# Patient Record
Sex: Male | Born: 1960 | Marital: Single | Smoking: Former smoker
Health system: Southern US, Community
[De-identification: ages and names within clinical notes are randomized; demographics above are authoritative.]

## PROBLEM LIST (undated history)

## (undated) DIAGNOSIS — D649 Anemia, unspecified: Secondary | ICD-10-CM

## (undated) DIAGNOSIS — I340 Nonrheumatic mitral (valve) insufficiency: Secondary | ICD-10-CM

## (undated) DIAGNOSIS — I10 Essential (primary) hypertension: Secondary | ICD-10-CM

## (undated) DIAGNOSIS — I5043 Acute on chronic combined systolic (congestive) and diastolic (congestive) heart failure: Secondary | ICD-10-CM

## (undated) DIAGNOSIS — I350 Nonrheumatic aortic (valve) stenosis: Secondary | ICD-10-CM

## (undated) DIAGNOSIS — I272 Pulmonary hypertension, unspecified: Secondary | ICD-10-CM

## (undated) DIAGNOSIS — I4891 Unspecified atrial fibrillation: Secondary | ICD-10-CM

## (undated) DIAGNOSIS — N186 End stage renal disease: Secondary | ICD-10-CM

## (undated) DIAGNOSIS — I2510 Atherosclerotic heart disease of native coronary artery without angina pectoris: Secondary | ICD-10-CM

## (undated) DIAGNOSIS — I50810 Right heart failure, unspecified: Secondary | ICD-10-CM

## (undated) DIAGNOSIS — B9561 Methicillin susceptible Staphylococcus aureus infection as the cause of diseases classified elsewhere: Secondary | ICD-10-CM

## (undated) DIAGNOSIS — E785 Hyperlipidemia, unspecified: Secondary | ICD-10-CM

## (undated) DIAGNOSIS — I071 Rheumatic tricuspid insufficiency: Secondary | ICD-10-CM

## (undated) DIAGNOSIS — R7881 Bacteremia: Secondary | ICD-10-CM

## (undated) HISTORY — DX: Nonrheumatic aortic (valve) stenosis: I35.0

## (undated) HISTORY — DX: Methicillin susceptible Staphylococcus aureus infection as the cause of diseases classified elsewhere: B95.61

## (undated) HISTORY — PX: OTHER SURGICAL HISTORY: SHX169

## (undated) HISTORY — DX: Atherosclerotic heart disease of native coronary artery without angina pectoris: I25.10

## (undated) HISTORY — DX: Essential (primary) hypertension: I10

## (undated) HISTORY — DX: Pulmonary hypertension, unspecified: I27.20

## (undated) HISTORY — DX: Anemia, unspecified: D64.9

## (undated) HISTORY — DX: Bacteremia: R78.81

## (undated) HISTORY — DX: Unspecified atrial fibrillation: I48.91

## (undated) HISTORY — DX: Hyperlipidemia, unspecified: E78.5

## (undated) HISTORY — DX: Nonrheumatic mitral (valve) insufficiency: I34.0

## (undated) HISTORY — PX: ESOPHAGOGASTRODUODENOSCOPY: SHX1529

## (undated) HISTORY — DX: End stage renal disease: N18.6

---

## 2016-05-24 DIAGNOSIS — I340 Nonrheumatic mitral (valve) insufficiency: Secondary | ICD-10-CM

## 2016-05-24 DIAGNOSIS — I350 Nonrheumatic aortic (valve) stenosis: Secondary | ICD-10-CM

## 2016-05-24 DIAGNOSIS — I272 Pulmonary hypertension, unspecified: Secondary | ICD-10-CM | POA: Insufficient documentation

## 2016-05-24 DIAGNOSIS — I10 Essential (primary) hypertension: Secondary | ICD-10-CM | POA: Insufficient documentation

## 2016-05-24 HISTORY — DX: Nonrheumatic aortic (valve) stenosis: I35.0

## 2016-05-24 HISTORY — DX: Nonrheumatic mitral (valve) insufficiency: I34.0

## 2016-05-25 ENCOUNTER — Encounter: Payer: Medicare Other | Admitting: Thoracic Surgery (Cardiothoracic Vascular Surgery)

## 2016-05-31 ENCOUNTER — Inpatient Hospital Stay (HOSPITAL_COMMUNITY)
Admission: AD | Admit: 2016-05-31 | Discharge: 2016-06-09 | DRG: 286 | Disposition: A | Discharge status: Hospice | Payer: Medicare Other | Source: Other Acute Inpatient Hospital | Attending: Internal Medicine | Admitting: Internal Medicine

## 2016-05-31 DIAGNOSIS — T82898A Other specified complication of vascular prosthetic devices, implants and grafts, initial encounter: Secondary | ICD-10-CM | POA: Diagnosis not present

## 2016-05-31 DIAGNOSIS — R570 Cardiogenic shock: Secondary | ICD-10-CM | POA: Diagnosis present

## 2016-05-31 DIAGNOSIS — R579 Shock, unspecified: Secondary | ICD-10-CM | POA: Diagnosis present

## 2016-05-31 DIAGNOSIS — Y848 Other medical procedures as the cause of abnormal reaction of the patient, or of later complication, without mention of misadventure at the time of the procedure: Secondary | ICD-10-CM | POA: Diagnosis present

## 2016-05-31 DIAGNOSIS — B9562 Methicillin resistant Staphylococcus aureus infection as the cause of diseases classified elsewhere: Secondary | ICD-10-CM | POA: Diagnosis present

## 2016-05-31 DIAGNOSIS — J9621 Acute and chronic respiratory failure with hypoxia: Secondary | ICD-10-CM | POA: Diagnosis present

## 2016-05-31 DIAGNOSIS — T80219A Unspecified infection due to central venous catheter, initial encounter: Principal | ICD-10-CM | POA: Diagnosis present

## 2016-05-31 DIAGNOSIS — N186 End stage renal disease: Secondary | ICD-10-CM | POA: Diagnosis present

## 2016-05-31 DIAGNOSIS — R7881 Bacteremia: Secondary | ICD-10-CM | POA: Diagnosis present

## 2016-05-31 DIAGNOSIS — J9600 Acute respiratory failure, unspecified whether with hypoxia or hypercapnia: Secondary | ICD-10-CM | POA: Diagnosis present

## 2016-05-31 DIAGNOSIS — E1122 Type 2 diabetes mellitus with diabetic chronic kidney disease: Secondary | ICD-10-CM | POA: Diagnosis present

## 2016-05-31 DIAGNOSIS — E785 Hyperlipidemia, unspecified: Secondary | ICD-10-CM | POA: Diagnosis present

## 2016-05-31 DIAGNOSIS — Z6833 Body mass index (BMI) 33.0-33.9, adult: Secondary | ICD-10-CM | POA: Diagnosis not present

## 2016-05-31 DIAGNOSIS — J449 Chronic obstructive pulmonary disease, unspecified: Secondary | ICD-10-CM | POA: Diagnosis present

## 2016-05-31 DIAGNOSIS — Z86718 Personal history of other venous thrombosis and embolism: Secondary | ICD-10-CM

## 2016-05-31 DIAGNOSIS — E669 Obesity, unspecified: Secondary | ICD-10-CM | POA: Diagnosis present

## 2016-05-31 DIAGNOSIS — Z515 Encounter for palliative care: Secondary | ICD-10-CM | POA: Diagnosis present

## 2016-05-31 DIAGNOSIS — I132 Hypertensive heart and chronic kidney disease with heart failure and with stage 5 chronic kidney disease, or end stage renal disease: Secondary | ICD-10-CM | POA: Diagnosis present

## 2016-05-31 DIAGNOSIS — N179 Acute kidney failure, unspecified: Secondary | ICD-10-CM | POA: Diagnosis not present

## 2016-05-31 DIAGNOSIS — N189 Chronic kidney disease, unspecified: Secondary | ICD-10-CM | POA: Diagnosis present

## 2016-05-31 DIAGNOSIS — J810 Acute pulmonary edema: Secondary | ICD-10-CM | POA: Diagnosis not present

## 2016-05-31 DIAGNOSIS — I071 Rheumatic tricuspid insufficiency: Secondary | ICD-10-CM | POA: Diagnosis present

## 2016-05-31 DIAGNOSIS — L8990 Pressure ulcer of unspecified site, unspecified stage: Secondary | ICD-10-CM | POA: Diagnosis present

## 2016-05-31 DIAGNOSIS — I5043 Acute on chronic combined systolic (congestive) and diastolic (congestive) heart failure: Secondary | ICD-10-CM | POA: Diagnosis present

## 2016-05-31 DIAGNOSIS — D631 Anemia in chronic kidney disease: Secondary | ICD-10-CM | POA: Diagnosis present

## 2016-05-31 DIAGNOSIS — R0602 Shortness of breath: Secondary | ICD-10-CM | POA: Diagnosis present

## 2016-05-31 DIAGNOSIS — Z66 Do not resuscitate: Secondary | ICD-10-CM | POA: Diagnosis present

## 2016-05-31 DIAGNOSIS — I2510 Atherosclerotic heart disease of native coronary artery without angina pectoris: Secondary | ICD-10-CM | POA: Diagnosis present

## 2016-05-31 DIAGNOSIS — I4891 Unspecified atrial fibrillation: Secondary | ICD-10-CM | POA: Diagnosis present

## 2016-05-31 DIAGNOSIS — I083 Combined rheumatic disorders of mitral, aortic and tricuspid valves: Secondary | ICD-10-CM | POA: Diagnosis present

## 2016-05-31 DIAGNOSIS — Z79899 Other long term (current) drug therapy: Secondary | ICD-10-CM

## 2016-05-31 DIAGNOSIS — Z992 Dependence on renal dialysis: Secondary | ICD-10-CM

## 2016-05-31 DIAGNOSIS — I9589 Other hypotension: Secondary | ICD-10-CM | POA: Diagnosis present

## 2016-05-31 DIAGNOSIS — I270 Primary pulmonary hypertension: Secondary | ICD-10-CM | POA: Diagnosis present

## 2016-05-31 DIAGNOSIS — Z452 Encounter for adjustment and management of vascular access device: Secondary | ICD-10-CM

## 2016-05-31 DIAGNOSIS — D649 Anemia, unspecified: Secondary | ICD-10-CM | POA: Diagnosis not present

## 2016-05-31 DIAGNOSIS — I50810 Right heart failure, unspecified: Secondary | ICD-10-CM

## 2016-05-31 DIAGNOSIS — I340 Nonrheumatic mitral (valve) insufficiency: Secondary | ICD-10-CM | POA: Diagnosis present

## 2016-05-31 DIAGNOSIS — I350 Nonrheumatic aortic (valve) stenosis: Secondary | ICD-10-CM

## 2016-05-31 DIAGNOSIS — I10 Essential (primary) hypertension: Secondary | ICD-10-CM | POA: Diagnosis present

## 2016-05-31 DIAGNOSIS — I248 Other forms of acute ischemic heart disease: Secondary | ICD-10-CM | POA: Diagnosis present

## 2016-05-31 DIAGNOSIS — J811 Chronic pulmonary edema: Secondary | ICD-10-CM

## 2016-05-31 DIAGNOSIS — Z9981 Dependence on supplemental oxygen: Secondary | ICD-10-CM

## 2016-05-31 DIAGNOSIS — I361 Nonrheumatic tricuspid (valve) insufficiency: Secondary | ICD-10-CM | POA: Diagnosis not present

## 2016-05-31 DIAGNOSIS — I272 Pulmonary hypertension, unspecified: Secondary | ICD-10-CM | POA: Diagnosis present

## 2016-05-31 DIAGNOSIS — I342 Nonrheumatic mitral (valve) stenosis: Secondary | ICD-10-CM | POA: Diagnosis not present

## 2016-05-31 DIAGNOSIS — N185 Chronic kidney disease, stage 5: Secondary | ICD-10-CM | POA: Diagnosis not present

## 2016-05-31 DIAGNOSIS — Z09 Encounter for follow-up examination after completed treatment for conditions other than malignant neoplasm: Secondary | ICD-10-CM

## 2016-05-31 DIAGNOSIS — J9601 Acute respiratory failure with hypoxia: Secondary | ICD-10-CM | POA: Diagnosis not present

## 2016-05-31 HISTORY — DX: Rheumatic tricuspid insufficiency: I07.1

## 2016-05-31 HISTORY — DX: Right heart failure, unspecified: I50.810

## 2016-05-31 HISTORY — DX: Acute on chronic combined systolic (congestive) and diastolic (congestive) heart failure: I50.43

## 2016-05-31 LAB — CORTISOL: Cortisol, Plasma: 17.2 ug/dL

## 2016-05-31 LAB — CBC WITH DIFFERENTIAL/PLATELET
BASOS ABS: 0 10*3/uL (ref 0.0–0.1)
Basophils Relative: 0 %
Eosinophils Absolute: 0.3 10*3/uL (ref 0.0–0.7)
Eosinophils Relative: 4 %
HEMATOCRIT: 27.9 % — AB (ref 39.0–52.0)
Hemoglobin: 8.1 g/dL — ABNORMAL LOW (ref 13.0–17.0)
LYMPHS ABS: 0.9 10*3/uL (ref 0.7–4.0)
Lymphocytes Relative: 11 %
MCH: 29.9 pg (ref 26.0–34.0)
MCHC: 29 g/dL — ABNORMAL LOW (ref 30.0–36.0)
MCV: 103 fL — ABNORMAL HIGH (ref 78.0–100.0)
MONOS PCT: 2 %
Monocytes Absolute: 0.2 10*3/uL (ref 0.1–1.0)
Neutro Abs: 6.9 10*3/uL (ref 1.7–7.7)
Neutrophils Relative %: 83 %
PLATELETS: 185 10*3/uL (ref 150–400)
RBC: 2.71 MIL/uL — AB (ref 4.22–5.81)
RDW: 20.8 % — AB (ref 11.5–15.5)
WBC: 8.3 10*3/uL (ref 4.0–10.5)

## 2016-05-31 LAB — COMPREHENSIVE METABOLIC PANEL
ALBUMIN: 2.9 g/dL — AB (ref 3.5–5.0)
ALT: 11 U/L — ABNORMAL LOW (ref 17–63)
AST: 24 U/L (ref 15–41)
Alkaline Phosphatase: 95 U/L (ref 38–126)
Anion gap: 12 (ref 5–15)
BUN: 27 mg/dL — AB (ref 6–20)
CHLORIDE: 105 mmol/L (ref 101–111)
CO2: 21 mmol/L — AB (ref 22–32)
Calcium: 8.6 mg/dL — ABNORMAL LOW (ref 8.9–10.3)
Creatinine, Ser: 4.6 mg/dL — ABNORMAL HIGH (ref 0.61–1.24)
GFR calc Af Amer: 15 mL/min — ABNORMAL LOW (ref >60)
GFR calc non Af Amer: 13 mL/min — ABNORMAL LOW (ref >60)
GLUCOSE: 82 mg/dL (ref 65–99)
POTASSIUM: 4.6 mmol/L (ref 3.5–5.1)
SODIUM: 138 mmol/L (ref 135–145)
Total Bilirubin: 2 mg/dL — ABNORMAL HIGH (ref 0.3–1.2)
Total Protein: 6.3 g/dL — ABNORMAL LOW (ref 6.5–8.1)

## 2016-05-31 LAB — PHOSPHORUS: Phosphorus: 4.7 mg/dL — ABNORMAL HIGH (ref 2.5–4.6)

## 2016-05-31 LAB — MRSA PCR SCREENING: MRSA BY PCR: POSITIVE — AB

## 2016-05-31 LAB — LACTIC ACID, PLASMA: Lactic Acid, Venous: 1.8 mmol/L (ref 0.5–1.9)

## 2016-05-31 LAB — MAGNESIUM: MAGNESIUM: 2 mg/dL (ref 1.7–2.4)

## 2016-05-31 LAB — PROCALCITONIN: PROCALCITONIN: 0.91 ng/mL

## 2016-05-31 MED ORDER — HEPARIN SODIUM (PORCINE) 5000 UNIT/ML IJ SOLN
5000.0000 [IU] | Freq: Three times a day (TID) | INTRAMUSCULAR | Status: DC
  Administered 2016-05-31 – 2016-06-09 (×24): 5000 [IU] via SUBCUTANEOUS
  Filled 2016-05-31 (×24): qty 1

## 2016-05-31 MED ORDER — ENOXAPARIN SODIUM 30 MG/0.3ML ~~LOC~~ SOLN: 30.0000 mg | SUBCUTANEOUS | Status: DC

## 2016-05-31 MED ORDER — SODIUM CHLORIDE 0.9 % IV SOLN
250.0000 mL | INTRAVENOUS | Status: DC | PRN
  Administered 2016-06-01: 250 mL via INTRAVENOUS

## 2016-05-31 MED ORDER — CETYLPYRIDINIUM CHLORIDE 0.05 % MT LIQD
7.0000 mL | Freq: Two times a day (BID) | OROMUCOSAL | Status: DC
  Administered 2016-06-01 – 2016-06-08 (×15): 7 mL via OROMUCOSAL

## 2016-05-31 MED ORDER — SODIUM CHLORIDE 0.9% FLUSH: 10.0000 mL | INTRAVENOUS | Status: DC | PRN

## 2016-05-31 MED ORDER — ACETAMINOPHEN 325 MG PO TABS
650.0000 mg | ORAL_TABLET | ORAL | Status: DC | PRN
Start: 2016-05-31 — End: 2016-06-09

## 2016-05-31 MED ORDER — SODIUM CHLORIDE 0.9% FLUSH: 3.0000 mL | INTRAVENOUS | Status: DC | PRN

## 2016-05-31 MED ORDER — PANTOPRAZOLE SODIUM 40 MG PO TBEC
40.0000 mg | DELAYED_RELEASE_TABLET | Freq: Every day | ORAL | Status: DC
Start: 2016-05-31 — End: 2016-06-09
  Administered 2016-05-31 – 2016-06-08 (×9): 40 mg via ORAL
  Filled 2016-05-31 (×9): qty 1

## 2016-05-31 MED ORDER — FAMOTIDINE IN NACL 20-0.9 MG/50ML-% IV SOLN: 20.0000 mg | Freq: Two times a day (BID) | INTRAVENOUS | Status: DC

## 2016-05-31 MED ORDER — SODIUM CHLORIDE 0.9 % IV SOLN: 250.0000 mL | INTRAVENOUS | Status: DC | PRN

## 2016-05-31 MED ORDER — SODIUM CHLORIDE 0.9% FLUSH
10.0000 mL | Freq: Two times a day (BID) | INTRAVENOUS | Status: DC
  Administered 2016-06-01 – 2016-06-07 (×13): 10 mL
  Administered 2016-06-08: 20 mL

## 2016-05-31 MED ORDER — ONDANSETRON HCL 4 MG/2ML IJ SOLN
4.0000 mg | Freq: Four times a day (QID) | INTRAMUSCULAR | Status: DC | PRN
  Administered 2016-06-02 – 2016-06-08 (×6): 4 mg via INTRAVENOUS
  Filled 2016-05-31 (×6): qty 2

## 2016-05-31 MED ORDER — SODIUM CHLORIDE 0.9% FLUSH
3.0000 mL | Freq: Two times a day (BID) | INTRAVENOUS | Status: DC
  Administered 2016-05-31 – 2016-06-07 (×5): 3 mL via INTRAVENOUS

## 2016-05-31 MED ORDER — MUPIROCIN 2 % EX OINT
1.0000 "application " | TOPICAL_OINTMENT | Freq: Two times a day (BID) | CUTANEOUS | Status: AC
  Administered 2016-06-01 – 2016-06-05 (×9): 1 via NASAL
  Filled 2016-05-31 (×4): qty 22

## 2016-05-31 MED ORDER — CHLORHEXIDINE GLUCONATE CLOTH 2 % EX PADS
6.0000 | MEDICATED_PAD | Freq: Every day | CUTANEOUS | Status: AC
  Administered 2016-06-01 – 2016-06-05 (×5): 6 via TOPICAL

## 2016-05-31 NOTE — Progress Notes (Signed)
Patient does not make urine. RN discontinued urine culture order.

## 2016-05-31 NOTE — Progress Notes (Signed)
Elink cameraed in to do rounds. Patient blood pressure 71/50. Verbal orders to only call if systolic blood pressure below 60 and patient has a change in LOC.

## 2016-05-31 NOTE — Progress Notes (Signed)
eLink Physician-Brief Progress Note Patient Name: Reginald Fields DOB: December 30, 1960 MRN: [id number]   Date of Service  05/31/2016  HPI/Events of Note  55 yo AAM admitted from [facility] ICU, camera check-patient alert and awake, No complaints, patient sated that his normal BP is 60/40. Patient NAD, mentating, no resp issues. BP pending  eICU Interventions  Admission orders and basic labs ordered     Intervention Category Evaluation Type: New Patient Evaluation  [place] 05/31/2016, 4:50 PM

## 2016-05-31 NOTE — Consult Note (Signed)
CARDIOLOGY CONSULT NOTE  Patient ID: Reginald Fields, MRN: [id number], DOB/AGE: 11-May-1961 55 y.o. Admit date: 05/31/2016 Date of Consult: 05/31/2016  Primary Physician: No PCP Per Patient Referring Physician: Dr Tyson Alias  Chief Complaint: Shortness of breath  Reason for Consultation: Aortic Stenosis  HPI: 55 year old patient with end-stage renal disease transferred from [facility] with cardiogenic shock. The patient is reported to have severe aortic stenosis. He was pending evaluation by Dr. Dorris Fetch for cardiac surgery, but the patient is too sick to be evaluated as an outpatient.  He tells me that he's been hospitalized several times in the past year with congestive heart failure and hypotension. Dialysis has been very difficult for him because he cannot maintain an adequate blood pressure. He has been on dialysis now for 6 years. He's been followed for aortic stenosis but has never been told he needed cardiac surgery until just recently. The patient was recently hospitalized, then discharged to a skilled nursing facility for 20 days, then returned home this past Friday. He states that he was so weak when he was at home he couldn't get up off the couch. By Sunday he called EMS who took him back to the hospital. He has reportedly had an echocardiogram and TEE, but I cannot find results of either study in the records that were sent. The patient is here now for cardiac surgical evaluation.  He complains of shortness of breath at rest and with activity. States that he hasn't been able to take more than just a couple of steps for the last 3 months because of weakness in his legs and marked shortness of breath. One year ago he was able to do normal sedentary activities and things such as going out to the store. He lives with his mother and his sister. He has not had any recent chest pain. He's had no frank syncope or near syncope. At the time of my interview his systolic  blood pressure is in the 60s. He states that this is "normal for me."  Medical History:  Past Medical History:  Diagnosis Date  . A-fib (HCC)    with a CHADS-VASC of 2  . Aortic stenosis 05/24/2016  . CAD (coronary artery disease)    non obstructive   . End stage renal disease (HCC)    on Hemodialysis    . Hyperlipidemia   . Hypertension   . Mitral insufficiency 05/24/2016  . Mitral regurgitation   . Pulmonary hypertension (HCC)   . Severe anemia    due to recurrent GI bleeds  . Staphylococcus aureus bacteremia       Surgical History:  Past Surgical History:  Procedure Laterality Date  . ESOPHAGOGASTRODUODENOSCOPY    . right arm fistula    . right subclavian permcath       Home Meds: Prior to Admission medications   Not on File    Inpatient Medications:  . heparin subcutaneous  5,000 Units Subcutaneous Q8H  . pantoprazole  40 mg Oral QHS  . sodium chloride flush  3 mL Intravenous Q12H      Allergies:  Allergies  Allergen Reactions  . Hectorol [Doxercalciferol] Other (See Comments)    unknown  . Iodinated Diagnostic Agents Other (See Comments)    unknown  . Iron Other (See Comments)    unknown  . Betadine [Povidone Iodine] Rash    Social History   Social History  . Marital status: Single    Spouse name: N/A  . Number of  children: N/A  . Years of education: N/A   Occupational History  . Not on file.   Social History Main Topics  . Smoking status: Not on file  . Smokeless tobacco: Never Used  . Alcohol use No  . Drug use: No  . Sexual activity: Not on file   Other Topics Concern  . Not on file   Social History Narrative  . No narrative on file     Family History  Problem Relation Age of Onset  . Heart disease Father   . Heart attack Father   . Hypertension Mother   . Diabetes Sister   . Hypertension Sister   . Hypertension Brother      Review of Systems: General: negative for chills, fever, night sweats or weight changes.  ENT:  negative for rhinorrhea or epistaxis Cardiovascular: Negative for heart palpitations, positive for swelling Dermatological: negative for rash Respiratory: Positive for cough positive for shortness of breath GI: negative for nausea, vomiting, diarrhea, bright red blood per rectum, melena, or hematemesis GU: Does not make urine Neurologic: negative for visual changes, syncope, headache, or dizziness Heme: no easy bruising or bleeding Endo: negative for excessive thirst, thyroid disorder, or flushing Musculoskeletal: negative for joint pain or swelling, negative for myalgias  All other systems reviewed and are otherwise negative except as noted above.  Physical Exam: Blood pressure (!) 64/42, temperature 97.5 F (36.4 C), temperature source Oral, resp. rate 16, height 5\' 9"  (1.753 m), weight 121.7 kg (268 lb 4.8 oz), SpO2 100 %. Pt is alert and oriented, pleasant obese male in no distress HEENT: normal Neck: JVP normal. Carotid upstrokes delayed and diminished Lungs: equal expansion, clear bilaterally CV: Regular with grade 4/6 harsh late peaking systolic murmur best heard at the LV apex Abd: soft, NT, obese Back: no CVA tenderness Ext: Anasarca is present with 2+ edema in both legs and arms Skin: warm and dry without rash Neuro: CNII-XII intact             Strength intact = bilaterally    Labs: No results for input(s): CKTOTAL, CKMB, TROPONINI in the last 72 hours. No results found for: WBC, HGB, HCT, MCV, PLT No results for input(s): NA, K, CL, CO2, BUN, CREATININE, CALCIUM, PROT, BILITOT, ALKPHOS, ALT, AST, GLUCOSE in the last 168 hours.  Invalid input(s): LABALBU No results found for: CHOL, HDL, LDLCALC, TRIG No results found for: DDIMER  Radiology/Studies:  No results found.  EKG: Pending  Cardiac Studies: Pending  ASSESSMENT AND PLAN:  1. Hypotension, acute on chronic 2. Acute on chronic diastolic heart failure, NYHA functional class IV 3. Probable severe aortic  stenosis 4. End-stage renal disease 5. Anemia, probable acute on chronic with hemoglobin documented at 6.9 on July 23 6. Elevated troponin likely demand ischemia 7. Recent upper extremity DVT treated with Eliquis  Very complex patient who's been extremely ill for the last 3 months. He has poor functional capacity and basically has not been ambulatory over recent months because of his hypotension and heart failure. By report he has severe aortic stenosis. There are no specific results of his echocardiogram or images to review at this time. I have reviewed extensive hospital records including labs, radiographic data, and progress notes. Will repeat an echocardiogram to get a better assessment of documented cardiac issues such as aortic stenosis and pulmonary hypertension. The patient will need right and left heart catheterization. I have reviewed the risks, indications, and alternatives to cardiac catheterization, with the patient. Risks include  but are not limited to bleeding, infection, vascular injury, stroke, myocardial infection, arrhythmia, kidney injury, radiation-related injury in the case of prolonged fluoroscopy use, emergency cardiac surgery, and death. The patient understands the risks of serious complication is 1-2 in 1000 with diagnostic cardiac cath and 1-2% or less with angioplasty/stenting.   I have reviewed the natural history of aortic stenosis with the patient. We have discussed the limitations of medical therapy and the poor prognosis associated with symptomatic aortic stenosis. We have reviewed potential treatment options, including palliative medical therapy, conventional surgical aortic valve replacement, and transcatheter aortic valve replacement. He will be at very high risk of complications regardless of treatment because of ESRD, acute on chronic hypotension, and severe debilitation.  We really need to better define his cardiac problems with an updated echo and heart  catheterization. He will undergo evaluation by Dr Dorris Fetch with cardiac surgery to further assess treatment options. Other medical comorbidities to be addressed by the CCM and renal teams. Will follow with you, thanks.  SignedTonny Bollman MD, [facility] 05/31/2016, 6:39 PM

## 2016-05-31 NOTE — H&P (Signed)
PULMONARY / CRITICAL CARE MEDICINE   Name: Reginald Fields MRN: [id number] DOB: June 15, 1961    ADMISSION DATE:  05/31/2016 CONSULTATION DATE:  05/31/2016  REFERRING MD:  [facility] MD  CHIEF COMPLAINT:  SOB  HISTORY OF PRESENT ILLNESS:   55 year old male with PMH as below, which includes known severe AS, COPD (on chronic 3L O2), atrial fibrillation, ESRD (on HD TTS), HTN, PAH, and bacteremia. He was admitted to [facility] with complaints of SOB and bilateral edema which have been progressive for 3 months. He has been more hypotensive recently which has limited HD to an extent. Also beat blockers have been stopped. He underwent HD 4/24 with removal of 1L. He also required 1 unit PRBC transfusion. HD limited by hypotension. 7/25 he was still having SOB and pain in the legs and remained hypotensive. He was transferred to ICU. SOB thought to be from systolic CHF secondary to severe AS. Patient is followed by CVTS in [place], so he has been transferred to [facility] for further evaluation.   PAST MEDICAL HISTORY :  He  has a past medical history of A-fib (HCC); Aortic stenosis (05/24/2016); CAD (coronary artery disease); End stage renal disease (HCC); Hyperlipidemia; Hypertension; Mitral insufficiency (05/24/2016); Mitral regurgitation; Pulmonary hypertension (HCC); Severe anemia; and Staphylococcus aureus bacteremia.  PAST SURGICAL HISTORY: He  has a past surgical history that includes Esophagogastroduodenoscopy; right arm fistula; and right subclavian permcath.  Allergies  Allergen Reactions  . Hectorol [Doxercalciferol] Other (See Comments)    unknown  . Iodinated Diagnostic Agents Other (See Comments)    unknown  . Iron Other (See Comments)    unknown  . Betadine [Povidone Iodine] Rash    No current facility-administered medications on file prior to encounter.    No current outpatient prescriptions on file prior to encounter.    FAMILY HISTORY:  His has no family  status information on file.    SOCIAL HISTORY: He  does not have a smoking history on file. He has never used smokeless tobacco. He reports that he does not drink alcohol or use drugs.  REVIEW OF SYSTEMS:   Bolds are positive  Constitutional: weight loss, gain, night sweats, Fevers, chills, fatigue .  HEENT: headaches, Sore throat, sneezing, nasal congestion, post nasal drip, Difficulty swallowing, Tooth/dental problems, visual complaints visual changes, ear ache CV:  chest pain, radiates: ,Orthopnea, PND, swelling in lower extremities, dizziness, palpitations, syncope.  GI  heartburn, indigestion, abdominal pain, nausea, vomiting, diarrhea, change in bowel habits, loss of appetite, bloody stools.  Resp: cough, productive: , hemoptysis, dyspnea, chest pain, pleuritic.  Skin: rash or itching or icterus GU: dysuria, change in color of urine, urgency or frequency. flank pain, hematuria  MS: joint pain or swelling. decreased range of motion  Psych: change in mood or affect. depression or anxiety.  Neuro: difficulty with speech, weakness, numbness, ataxia    SUBJECTIVE:    VITAL SIGNS: There were no vitals taken for this visit.  HEMODYNAMICS:    VENTILATOR SETTINGS:    INTAKE / OUTPUT: No intake/output data recorded.  PHYSICAL EXAMINATION: General:  Obese male in NAD Neuro:  Alert, oriented, non-focal HEENT:  [place]/AT, PERRL, no appreciable  Cardiovascular:  RRR, 4/6 systolic murmur Lungs:  Distant, no wheeze. Effort normal.  Abdomen:  Soft, non-tender, non-distended Musculoskeletal:  No acute deformity or ROM limitation Skin:  Grossly intact  LABS:  BMET No results for input(s): NA, K, CL, CO2, BUN, CREATININE, GLUCOSE in the last 168 hours.  Electrolytes  No results for input(s): CALCIUM, MG, PHOS in the last 168 hours.  CBC No results for input(s): WBC, HGB, HCT, PLT in the last 168 hours.  Coag's No results for input(s): APTT, INR in the last 168 hours.  Sepsis  Markers No results for input(s): LATICACIDVEN, PROCALCITON, O2SATVEN in the last 168 hours.  ABG No results for input(s): PHART, PCO2ART, PO2ART in the last 168 hours.  Liver Enzymes No results for input(s): AST, ALT, ALKPHOS, BILITOT, ALBUMIN in the last 168 hours.  Cardiac Enzymes No results for input(s): TROPONINI, PROBNP in the last 168 hours.  Glucose No results for input(s): GLUCAP in the last 168 hours.  Imaging No results found.   STUDIES:  Echo 7/7: LVEF 50-55%, PA pressures 72-50mmHg, severe AS, moderate MS, mod/sev TR.   CULTURES: MRSA in perm-cath from OSH Blood 7/25 >  ANTIBIOTICS:   SIGNIFICANT EVENTS:   LINES/TUBES: RIJ perm cath   ASSESSMENT / PLAN:  PULMONARY A: Acute on chronic hypoxemic respiratory failure secondary to pulmonary edema COPD without acute exacerbation  P:   Supplemental O2 HD vs CRRT for volume removal Scheduled and PRN nebs CXR STAT Pulmonary hygiene  CARDIOVASCULAR A:  Acute on chronic systolic/diastolic CHF Severe aortic stenosis Hypotension > chronic per pt baseline SBP in 70s.   P:  Telemetry monitoring Cards, CVTS to see Assess lactic, cortisol, troponin Echocardiogram EKG Will tolerate hypotension for now as mental status intact, consider pressors if worsen.   RENAL A:   ESRD on HD TTS  P:   Consult nephrology in AM  GASTROINTESTINAL A:   No acute issues  P:   Renal diet with fluid restriction Protonix for SUP  HEMATOLOGIC A:   Anemia of chronic illness  P:  Assess CBC Heparin SQ for VTE ppx  INFECTIOUS A:   ? MRSA bacteremia 1/2 bottles (this bottle from permcath)  P:   Hold ABX Check blood cultures May need to remove perm cath Does note appear toxic  ENDOCRINE A:   No acute issues   P:   Follow glucose on BMP  NEUROLOGIC A:   No acute issues  P:   monitor   FAMILY  - Updates: Patient updated bedside by DF  - Inter-disciplinary family meet or Palliative  Care meeting due by:  8/1   Joneen Roach, AGACNP-BC [place] Pulmonology/Critical Care Pager [phone] or [phone]  05/31/2016 5:40 PM    STAFF NOTE: Cindi Carbon, MD FACP have personally reviewed patient's available data, including medical history, events of note, physical examination and test results as part of my evaluation. I have discussed with resident/NP and other care providers such as pharmacist, RN and RRT. In addition, I personally evaluated patient and elicited key findings of: awake, alert, clear lungs, sys early to mid sys murmur 2 ics min radiation to carotids, abdo soft, catheter clean dry intact rt tunneled, sys 74 wide awake well perfused status, transferred secondary to concerns AS severe causing chf and difficult to manage with ESRD and presumed preload dependence, did have neg balance reported to me to have bene HD yesterday and today but only neg 860 cc,. But reported to RN to have 3 liters off, he runs low at baseline and have conerns accuracy in cuff as well, will have cardiology assessment, cvts will assess likely wed, will call renal in am and consider cvvhd attempts vs int HD on thur, assess lactic acid as it relates to symptomatic hypoperfusion sys pressure, assess cortisol, trop, ecg, will  start NEO if sys less 70 , he does not appear toxic or septic at all, catheter is clean, likely what is described is contamination as culture drawn from line and 1/2, keep line for now and call micro in am at outpt hospital, echo assessment needed for gradiant and valve area , likely will need cath The patient is critically ill with multiple organ systems failure and requires high complexity decision making for assessment and support, frequent evaluation and titration of therapies, application of advanced monitoring technologies and extensive interpretation of multiple databases.   Critical Care Time devoted to patient care services described in this note is35 Minutes.  This time reflects time of care of this signee: Rory Percy, MD FACP. This critical care time does not reflect procedure time, or teaching time or supervisory time of PA/NP/Med student/Med Resident etc but could involve care discussion time. Rest per NP/medical resident whose note is outlined above and that I agree with   Mcarthur Rossetti. Tyson Alias, MD, FACP Pgr: [phone] [place] [facility] 05/31/2016 10:48 PM

## 2016-06-01 ENCOUNTER — Encounter: Payer: Medicare Other | Admitting: Thoracic Surgery (Cardiothoracic Vascular Surgery)

## 2016-06-01 ENCOUNTER — Inpatient Hospital Stay (HOSPITAL_COMMUNITY): Payer: Medicare Other

## 2016-06-01 ENCOUNTER — Other Ambulatory Visit: Payer: Self-pay | Admitting: *Deleted

## 2016-06-01 DIAGNOSIS — N189 Chronic kidney disease, unspecified: Secondary | ICD-10-CM | POA: Diagnosis present

## 2016-06-01 DIAGNOSIS — T82898A Other specified complication of vascular prosthetic devices, implants and grafts, initial encounter: Secondary | ICD-10-CM

## 2016-06-01 DIAGNOSIS — N185 Chronic kidney disease, stage 5: Secondary | ICD-10-CM

## 2016-06-01 DIAGNOSIS — J810 Acute pulmonary edema: Secondary | ICD-10-CM

## 2016-06-01 DIAGNOSIS — L8990 Pressure ulcer of unspecified site, unspecified stage: Secondary | ICD-10-CM | POA: Insufficient documentation

## 2016-06-01 DIAGNOSIS — I350 Nonrheumatic aortic (valve) stenosis: Secondary | ICD-10-CM

## 2016-06-01 DIAGNOSIS — N179 Acute kidney failure, unspecified: Secondary | ICD-10-CM

## 2016-06-01 LAB — RENAL FUNCTION PANEL
ALBUMIN: 2.6 g/dL — AB (ref 3.5–5.0)
ANION GAP: 9 (ref 5–15)
BUN: 34 mg/dL — ABNORMAL HIGH (ref 6–20)
CALCIUM: 8.4 mg/dL — AB (ref 8.9–10.3)
CO2: 28 mmol/L (ref 22–32)
Chloride: 101 mmol/L (ref 101–111)
Creatinine, Ser: 5.35 mg/dL — ABNORMAL HIGH (ref 0.61–1.24)
GFR, EST AFRICAN AMERICAN: 13 mL/min — AB (ref >60)
GFR, EST NON AFRICAN AMERICAN: 11 mL/min — AB (ref >60)
Glucose, Bld: 96 mg/dL (ref 65–99)
PHOSPHORUS: 4.9 mg/dL — AB (ref 2.5–4.6)
Potassium: 4.1 mmol/L (ref 3.5–5.1)
SODIUM: 138 mmol/L (ref 135–145)

## 2016-06-01 LAB — CBC
HCT: 26.8 % — ABNORMAL LOW (ref 39.0–52.0)
HEMOGLOBIN: 7.9 g/dL — AB (ref 13.0–17.0)
MCH: 30.3 pg (ref 26.0–34.0)
MCHC: 29.5 g/dL — AB (ref 30.0–36.0)
MCV: 102.7 fL — AB (ref 78.0–100.0)
Platelets: 206 10*3/uL (ref 150–400)
RBC: 2.61 MIL/uL — AB (ref 4.22–5.81)
RDW: 20.5 % — ABNORMAL HIGH (ref 11.5–15.5)
WBC: 7.8 10*3/uL (ref 4.0–10.5)

## 2016-06-01 LAB — BASIC METABOLIC PANEL
Anion gap: 9 (ref 5–15)
BUN: 31 mg/dL — ABNORMAL HIGH (ref 6–20)
CHLORIDE: 101 mmol/L (ref 101–111)
CO2: 28 mmol/L (ref 22–32)
CREATININE: 5.13 mg/dL — AB (ref 0.61–1.24)
Calcium: 8.3 mg/dL — ABNORMAL LOW (ref 8.9–10.3)
GFR calc Af Amer: 13 mL/min — ABNORMAL LOW (ref >60)
GFR calc non Af Amer: 12 mL/min — ABNORMAL LOW (ref >60)
Glucose, Bld: 97 mg/dL (ref 65–99)
POTASSIUM: 3.9 mmol/L (ref 3.5–5.1)
Sodium: 138 mmol/L (ref 135–145)

## 2016-06-01 LAB — ECHOCARDIOGRAM COMPLETE
HEIGHTINCHES: 69 in
WEIGHTICAEL: 4342.18 [oz_av]

## 2016-06-01 MED ORDER — HEPARIN (PORCINE) 2000 UNITS/L FOR CRRT
INTRAVENOUS_CENTRAL | Status: DC | PRN
  Administered 2016-06-04: 21:00:00 via INTRAVENOUS_CENTRAL
  Filled 2016-06-01 (×3): qty 1000

## 2016-06-01 MED ORDER — HEPARIN SODIUM (PORCINE) 1000 UNIT/ML IJ SOLN: 1000.0000 [IU] | INTRAMUSCULAR | Status: DC | PRN

## 2016-06-01 MED ORDER — HEPARIN SODIUM (PORCINE) 1000 UNIT/ML DIALYSIS
1000.0000 [IU] | INTRAMUSCULAR | Status: DC | PRN
  Administered 2016-06-01: 2800 [IU] via INTRAVENOUS_CENTRAL
  Administered 2016-06-03: 3000 [IU] via INTRAVENOUS_CENTRAL
  Administered 2016-06-06: 4000 [IU] via INTRAVENOUS_CENTRAL
  Administered 2016-06-08 (×2): 1400 [IU] via INTRAVENOUS_CENTRAL
  Filled 2016-06-01: qty 6
  Filled 2016-06-01 (×2): qty 3
  Filled 2016-06-01: qty 6
  Filled 2016-06-01: qty 4
  Filled 2016-06-01 (×2): qty 6
  Filled 2016-06-01: qty 4
  Filled 2016-06-01: qty 6
  Filled 2016-06-01: qty 4
  Filled 2016-06-01 (×2): qty 6

## 2016-06-01 MED ORDER — VANCOMYCIN HCL IN DEXTROSE 750-5 MG/150ML-% IV SOLN
750.0000 mg | Freq: Once | INTRAVENOUS | Status: AC
  Administered 2016-06-01: 750 mg via INTRAVENOUS
  Filled 2016-06-01 (×2): qty 150

## 2016-06-01 MED ORDER — PRISMASOL BGK 4/2.5 32-4-2.5 MEQ/L IV SOLN
INTRAVENOUS | Status: DC
  Administered 2016-06-01 – 2016-06-08 (×58): via INTRAVENOUS_CENTRAL
  Filled 2016-06-01 (×86): qty 5000

## 2016-06-01 MED ORDER — PRISMASOL BGK 4/2.5 32-4-2.5 MEQ/L IV SOLN
INTRAVENOUS | Status: DC
  Administered 2016-06-01 – 2016-06-08 (×13): via INTRAVENOUS_CENTRAL
  Filled 2016-06-01 (×19): qty 5000

## 2016-06-01 MED ORDER — ARFORMOTEROL TARTRATE 15 MCG/2ML IN NEBU
15.0000 ug | INHALATION_SOLUTION | Freq: Two times a day (BID) | RESPIRATORY_TRACT | Status: DC
  Administered 2016-06-01 – 2016-06-08 (×15): 15 ug via RESPIRATORY_TRACT
  Filled 2016-06-01 (×17): qty 2

## 2016-06-01 MED ORDER — HYDROCORTISONE 2.5 % RE CREA
TOPICAL_CREAM | Freq: Four times a day (QID) | RECTAL | Status: DC | PRN
  Administered 2016-06-02: 1 via TOPICAL
  Administered 2016-06-03 (×2): via TOPICAL
  Administered 2016-06-04: 1 via TOPICAL
  Administered 2016-06-09: 09:00:00 via TOPICAL
  Filled 2016-06-01 (×2): qty 28.35

## 2016-06-01 MED ORDER — PRISMASOL BGK 4/2.5 32-4-2.5 MEQ/L IV SOLN
INTRAVENOUS | Status: DC
  Administered 2016-06-01 – 2016-06-08 (×7): via INTRAVENOUS_CENTRAL
  Filled 2016-06-01 (×10): qty 5000

## 2016-06-01 MED ORDER — ALTEPLASE 2 MG IJ SOLR: 2.0000 mg | Freq: Once | INTRAMUSCULAR | Status: DC | PRN

## 2016-06-01 MED ORDER — BUDESONIDE 0.5 MG/2ML IN SUSP
0.5000 mg | Freq: Two times a day (BID) | RESPIRATORY_TRACT | Status: DC
  Administered 2016-06-01 – 2016-06-08 (×15): 0.5 mg via RESPIRATORY_TRACT
  Filled 2016-06-01 (×17): qty 2

## 2016-06-01 NOTE — Progress Notes (Signed)
Pharmacy Antibiotic Note  Reginald Fields is a 55 y.o. male admitted on 05/31/2016 with MRSA bacteremia 1/2? (from perm cath) at [facility].  Pharmacy has been consulted for vancomycin dosing. Repeating cultures, d/c perm cath and give 24h line holiday. ESRD on HD, transitioning to CRRT - to start 7/27 per CCM note. Afeb, wbc wnl.  Appears patient received 1750mg  loading dose of vanc at [facility] 7/25 at 1600. Also was receiving Zosyn.  Plan: Vanc 750mg  IV x1 (to equal 2500mg  loading dose for HD w/ dose at OSH) F/u CRRT - maintenance dose Vanc 1250mg  IV q24h to start when appropriate for CRRT start ime Monitor clinical progress, c/s, abx plan/LOT Vanc levels as indicated                         Temp (24hrs), Avg:98.2 F (36.8 C), Min:97.5 F (36.4 C), Max:98.9 F (37.2 C)   Recent Labs Lab 05/31/16 1822 06/01/16 0521  WBC 8.3 7.8  CREATININE 4.60* 5.13*  LATICACIDVEN 1.8  --     Estimated Creatinine Clearance: 21.6 mL/min (by C-G formula based on SCr of 5.13 mg/dL).    Allergies  Allergen Reactions  . Hectorol [Doxercalciferol] Other (See Comments)    unknown  . Iodinated Diagnostic Agents Other (See Comments)    unknown  . Iron Other (See Comments)    unknown  . Betadine [Povidone Iodine] Rash    Antimicrobials this admission: 7/25 vanc (started at OSH) >> Zosyn at OSH  Dose adjustments this admission:  Microbiology results: MRSA in perm-cath from OSH Blood 7/25 > Perm-Cath culture 7/26 > 7/25 mrsa pcr: +   Babs Bertin, PharmD, [facility] Clinical Pharmacist Pager [phone] 06/01/2016 11:40 AM

## 2016-06-01 NOTE — Progress Notes (Signed)
[facility] Catheter Removal Procedure Note  Diagnosis: ESRD with Functioning AVF/AVGG  Plan:  Remove right diatek catheter  Consent signed:  Yes.   Time out completed:  yes Coumadin:  No. PT/INR (if applicable):   Other labs:   Procedure: 1.  Sterile prepping and draping over catheter area 2. 0 ml 2% lidocaine plain instilled at removal site. 3.  right catheter removed in its entirety with cuff in tact. 4.  Complications: none  5. Tip of catheter sent for culture:  Yes.     Patient tolerated procedure well:  yes Pressure held, no bleeding noted, dressing applied Instructions given to the pt regarding wound care and bleeding.  OtherClinton Gallant [facility] 06/01/2016 12:11 PM

## 2016-06-01 NOTE — Progress Notes (Signed)
    Subjective:  No changes reported. Still weak and short of breath.  Objective:  Vital Signs in the last 24 hours: Temp:  [97.5 F (36.4 C)-98.9 F (37.2 C)] 98.1 F (36.7 C) (07/26 0820) Pulse Rate:  [90-101] 99 (07/26 0600) Resp:  [9-29] 27 (07/26 0600) BP: (62-78)/(42-60) 68/48 (07/26 0600) SpO2:  [92 %-100 %] 95 % (07/26 0600) Weight:  [121.7 kg (268 lb 4.8 oz)-123.1 kg (271 lb 6.2 oz)] 123.1 kg (271 lb 6.2 oz) (07/26 0500)  Intake/Output from previous day: 07/25 0701 - 07/26 0700 In: 460 [P.O.:460] Out: -   Physical Exam: Pt is alert and oriented, chronically ill-appearing, in NAD HEENT: normal Neck: JVP - elevated Lungs: bibasilar rales CV: RRR with 3/6 harsh systolic murmur at the LV apex Abd: soft, NT Ext: diffuse edema unchanged  Lab Results:  Recent Labs  05/31/16 1822 06/01/16 0521  WBC 8.3 7.8  HGB 8.1* 7.9*  PLT 185 206    Recent Labs  05/31/16 1822 06/01/16 0521  NA 138 138  K 4.6 3.9  CL 105 101  CO2 21* 28  GLUCOSE 82 97  BUN 27* 31*  CREATININE 4.60* 5.13*   No results for input(s): TROPONINI in the last 72 hours.  Invalid input(s): CK, MB  Tele: Sinus rhythm  Assessment/Plan:  1. Hypotension/Cardiogenic shock, acute on chronic 2. Acute on chronic diastolic heart failure, NYHA functional class IV 3. Probable severe aortic stenosis 4. End-stage renal disease 5. Anemia, probable acute on chronic with hemoglobin documented at 6.9 on July 23 6. Elevated troponin likely demand ischemia 7. Recent upper extremity DVT   Renal team evaluation this am. Pt with marked volume overload and limitation of fluid removal in setting of hypotension. Likely CRT. Await 2D echo. Plan right/left heart cath when clinically improved and able to tolerate. Discussed with cardiac surgery team who will see him once work-up completed.  Tonny Bollman, M.D. 06/01/2016, 10:43 AM

## 2016-06-01 NOTE — Procedures (Signed)
Hemodialysis Insertion Procedure Note Reginald Fields [id number] March 02, 1961  Procedure: Insertion of Hemodialysis Catheter Type: 3 port  Indications: Hemodialysis   Procedure Details Consent: Risks of procedure as well as the alternatives and risks of each were explained to the (patient/caregiver).  Consent for procedure obtained. Time Out: Verified patient identification, verified procedure, site/side was marked, verified correct patient position, special equipment/implants available, medications/allergies/relevent history reviewed, required imaging and test results available.  Performed  Maximum sterile technique was used including antiseptics, cap, gloves, gown, hand hygiene, mask and sheet. Skin prep: Chlorhexidine; local anesthetic administered A antimicrobial bonded/coated triple lumen catheter was placed in the left internal jugular vein using the Seldinger technique. Ultrasound guidance used.Yes.   Catheter placed to 20 cm. Blood aspirated via all 3 ports and then flushed x 3. Line sutured x 2 and dressing applied.  Evaluation Blood flow good Complications: No apparent complications Patient did tolerate procedure well. Chest X-ray ordered to verify placement.  CXR: pending.  Joneen Roach, AGACNP-BC [place] Pulmonology/Critical Care Pager [phone] or [phone]  06/01/2016 4:58 PM  Alyson Reedy, M.D. [facility] Pulmonary/Critical Care Medicine. Pager: [phone]. After hours pager: [phone].

## 2016-06-01 NOTE — Progress Notes (Signed)
Received preop order however it appears pt will need PT given his limited mobility at home. Will follow from a distance on decision for OHS. Ethelda Chick CES, ACSM 10:09 AM 06/01/2016

## 2016-06-01 NOTE — Consult Note (Signed)
[facility] NOTE   MRN : [id number]  Reason for Consult: infected right tunneled dialysis catherter Referring Physician: Dr. Arlean Hopping  History of Present Illness: 55 y/o male with infected tunneled HD catheter.  He was admitted 05/31/2016 secondary to SOB and inability to ambulate due to fluid retention.   He was admitted last night, CXR shows diffuse pulm edema but pt denies any SOB , cough at rest.  Being eval for possible TAVRS.   While in hospital in [employer], he had 1/2 blood cultures + for MRSA.  Pt denies any fever, chills , catheter drainage.  The catheter has been in approx 4 yrs per pt.    We have been asked to remove the infected HD catheter.   Past medical history includes: heart valve stenosis, ESRD 6+ years, hyperlipidemia managed with Lipitor, and hypotension.  He denise DM.       Current Facility-Administered Medications  Medication Dose Route Frequency Provider Last Rate Last Dose  . 0.9 %  sodium chloride infusion  250 mL Intravenous PRN Erin Fulling, MD      . 0.9 %  sodium chloride infusion  250 mL Intravenous PRN Erin Fulling, MD      . acetaminophen (TYLENOL) tablet 650 mg  650 mg Oral Q4H PRN Erin Fulling, MD      . alteplase (CATHFLO ACTIVASE) injection 2 mg  2 mg Intracatheter Once PRN Delano Metz, MD      . antiseptic oral rinse (CPC / CETYLPYRIDINIUM CHLORIDE 0.05%) solution 7 mL  7 mL Mouth Rinse BID Nelda Bucks, MD   7 mL at 06/01/16 1000  . arformoterol (BROVANA) nebulizer solution 15 mcg  15 mcg Nebulization BID Jose Angelo A de Dios, MD      . budesonide (PULMICORT) nebulizer solution 0.5 mg  0.5 mg Nebulization BID Jose Angelo A Christene Slates, MD      . Chlorhexidine Gluconate Cloth 2 % PADS 6 each  6 each Topical Q0600 Nelda Bucks, MD   6 each at 06/01/16 0500  . heparin injection 1,000-4,000 Units  1,000-4,000 Units Intravenous PRN Pola Corn, NP      . heparin injection 1,000-6,000 Units  1,000-6,000  Units CRRT PRN Delano Metz, MD      . heparin injection 5,000 Units  5,000 Units Subcutaneous Q8H Duayne Cal, NP   5,000 Units at 06/01/16 0536  . heparinized saline (2000 units/L) primer fluid for CRRT   CRRT PRN Delano Metz, MD      . mupirocin ointment (BACTROBAN) 2 % 1 application  1 application Nasal BID Nelda Bucks, MD   1 application at 06/01/16 1007  . ondansetron (ZOFRAN) injection 4 mg  4 mg Intravenous Q6H PRN Erin Fulling, MD      . pantoprazole (PROTONIX) EC tablet 40 mg  40 mg Oral QHS Duayne Cal, NP   40 mg at 05/31/16 2135  . prismasol BGK 4/2.5 5,000 mL dialysis replacement fluid   CRRT Continuous Delano Metz, MD      . prismasol BGK 4/2.5 5,000 mL dialysis replacement fluid   CRRT Continuous Delano Metz, MD      . prismasol BGK 4/2.5 5,000 mL dialysis solution   CRRT Continuous Delano Metz, MD      . sodium chloride flush (NS) 0.9 % injection 10-40 mL  10-40 mL Intracatheter Q12H Nelda Bucks, MD   10 mL at 06/01/16 1008  . sodium chloride flush (NS) 0.9 %  injection 10-40 mL  10-40 mL Intracatheter PRN Nelda Bucks, MD      . sodium chloride flush (NS) 0.9 % injection 3 mL  3 mL Intravenous Q12H Erin Fulling, MD   3 mL at 06/01/16 1008  . sodium chloride flush (NS) 0.9 % injection 3 mL  3 mL Intravenous PRN Erin Fulling, MD        Pt meds include: Statin :Yes Betablocker: No ASA: Yes Other anticoagulants/antiplatelets: None  Past Medical History:  Diagnosis Date  . A-fib (HCC)    with a CHADS-VASC of 2  . Aortic stenosis 05/24/2016  . CAD (coronary artery disease)    non obstructive   . End stage renal disease (HCC)    on Hemodialysis    . Hyperlipidemia   . Hypertension   . Mitral insufficiency 05/24/2016  . Mitral regurgitation   . Pulmonary hypertension (HCC)   . Severe anemia    due to recurrent GI bleeds  . Staphylococcus aureus bacteremia     Past Surgical History:  Procedure Laterality Date  .  ESOPHAGOGASTRODUODENOSCOPY    . right arm fistula    . right subclavian permcath      Social History Social History  Substance Use Topics  . Smoking status: Not on file  . Smokeless tobacco: Never Used  . Alcohol use No    Family History Family History  Problem Relation Age of Onset  . Heart disease Father   . Heart attack Father   . Hypertension Mother   . Diabetes Sister   . Hypertension Sister   . Hypertension Brother     Allergies  Allergen Reactions  . Hectorol [Doxercalciferol] Other (See Comments)    unknown  . Iodinated Diagnostic Agents Other (See Comments)    unknown  . Iron Other (See Comments)    unknown  . Betadine [Povidone Iodine] Rash     REVIEW OF SYSTEMS  General: [ ]  Weight loss, [ ]  Fever, [ ]  chills Neurologic: [ ]  Dizziness, [ ]  Blackouts, [ ]  Seizure [ ]  Stroke, [ ]  "Mini stroke", [ ]  Slurred speech, [ ]  Temporary blindness; [ ]  weakness in arms or legs, [ ]  Hoarseness [ ]  Dysphagia Cardiac: [ ]  Chest pain/pressure, [ ]  Shortness of breath at rest [ ]  Shortness of breath with exertion, [ ]  Atrial fibrillation or irregular heartbeat  Vascular: [ ]  Pain in legs with walking, [ ]  Pain in legs at rest, [ ]  Pain in legs at night,  [ ]  Non-healing ulcer, [ ]  Blood clot in vein/DVT,   Pulmonary: [ ]  Home oxygen, [ ]  Productive cough, [ ]  Coughing up blood, [ ]  Asthma, [x]  SOB  [ ]  Wheezing [ ]  COPD Musculoskeletal:  [ ]  Arthritis, [ ]  Low back pain, [ ]  Joint pain Hematologic: [ ]  Easy Bruising, [ ]  Anemia; [ ]  Hepatitis Gastrointestinal: [ ]  Blood in stool, [ ]  Gastroesophageal Reflux/heartburn, Urinary: [x ] chronic Kidney disease, [ ]  on HD - [ ]  MWF or [x ] TTHS, [ ]  Burning with urination, [ ]  Difficulty urinating Skin: [ ]  Rashes, [ ]  Wounds Psychological: [ ]  Anxiety, [ ]  Depression  Physical Examination Vitals:   06/01/16 0500 06/01/16 0600 06/01/16 0820 06/01/16 1105  BP: (!) 78/57 (!) 68/48    Pulse:  99    Resp: (!) 23 (!) 27     Temp:   98.1 F (36.7 C) 98.4 F (36.9 C)  TempSrc:   Oral Oral  SpO2:  95%    Weight: 271 lb 6.2 oz (123.1 kg)     Height:       Body mass index is 40.08 kg/m.  General:  WDWN in NAD HENT: WNL Eyes: Pupils equal Pulmonary: normal non-labored breathing , without  rhonchi,  Wheezing, positive Rales bil. Cardiac: RRR, with positive murmur No carotid bruits Abdomen: soft, NT, no masses Skin: no rashes, ulcers noted;  no Gangrene , no cellulitis; no open wounds;   Vascular Exam/Pulses:palpable radial pulses bilaterally   Musculoskeletal: no muscle wasting or atrophy;  Neurologic: A&O X 3; Appropriate Affect ;  SENSATION: normal; MOTOR FUNCTION: 5/5 Symmetric UE, LE edema weakness grossly general  Speech is fluent/normal   Significant Diagnostic Studies: CBC Lab Results  Component Value Date   WBC 7.8 06/01/2016   HGB 7.9 (L) 06/01/2016   HCT 26.8 (L) 06/01/2016   MCV 102.7 (H) 06/01/2016   PLT 206 06/01/2016    BMET    Component Value Date/Time   NA 138 06/01/2016 0521   K 3.9 06/01/2016 0521   CL 101 06/01/2016 0521   CO2 28 06/01/2016 0521   GLUCOSE 97 06/01/2016 0521   BUN 31 (H) 06/01/2016 0521   CREATININE 5.13 (H) 06/01/2016 0521   CALCIUM 8.3 (L) 06/01/2016 0521   GFRNONAA 12 (L) 06/01/2016 0521   GFRAA 13 (L) 06/01/2016 0521   Estimated Creatinine Clearance: 21.6 mL/min (by C-G formula based on SCr of 5.13 mg/dL).  COAG No results found for: INR, PROTIME   Non-Invasive Vascular Imaging:   ASSESSMENT/PLAN:  Removal of infected tunneled HD catheter right IJ. Tip of catheter was sent for culture.    Clinton Gallant [facility] 06/01/2016 12:01 PM

## 2016-06-01 NOTE — Progress Notes (Signed)
  Echocardiogram 2D Echocardiogram has been performed.  Reginald Fields 06/01/2016, 12:51 PM

## 2016-06-01 NOTE — Consult Note (Addendum)
Renal Service Consult Note [facility]  Reginald Fields 06/01/2016 Reginald Fields D Requesting Physician:  Dr. Tyson Fields  Reason for Consult:  ESRD pt from [facility] w severe AS HPI: The patient is a 55 y.o. year-old on HD in [place], [place] for last 6 years.  Patient has history of heart valve stenosis, hypotension which appears chronic, anasarca.  He was admitted last night, CXR shows diffuse pulm edema but pt denies any SOB , cough at rest.  Being eval for possible TAVRS.  While in hospital in [place], he had 1/2 blood cultures + for MRSA.  Pt denies any fever, chills , catheter drainage.  The catheter has been in approx 4 yrs per pt.    No hx DM, +afib, nonobstructive CAD, HL, prior hx HTN, pulm HTN  Pt grew up in [place], primary contact is his mother.  Worked loading/ unloading trucks.  No etoh/ tob.  Has children , youngest is 8 yo finishing HS. Other two are working.     HD [place]   TTS  5h  400/1.5   106kg  2/2.5 bath   Heparin 7000 w mid Rx 3000 Getting Mircera 200 ug due on Sat 7/29 Getting loading dose of iron   ROS  denies CP  no joint pain   no HA  no blurry vision  no rash  no diarrhea  no nausea/ vomiting  no dysuria  no difficulty voiding  no change in urine color    Past Medical History  Past Medical History:  Diagnosis Date  . A-fib (HCC)    with a CHADS-VASC of 2  . Aortic stenosis 05/24/2016  . CAD (coronary artery disease)    non obstructive   . End stage renal disease (HCC)    on Hemodialysis    . Hyperlipidemia   . Hypertension   . Mitral insufficiency 05/24/2016  . Mitral regurgitation   . Pulmonary hypertension (HCC)   . Severe anemia    due to recurrent GI bleeds  . Staphylococcus aureus bacteremia    Past Surgical History  Past Surgical History:  Procedure Laterality Date  . ESOPHAGOGASTRODUODENOSCOPY    . right arm fistula    . right subclavian permcath     Family History  Family History  Problem Relation Age of  Onset  . Heart disease Father   . Heart attack Father   . Hypertension Mother   . Diabetes Sister   . Hypertension Sister   . Hypertension Brother    Social History  does not have a smoking history on file. He has never used smokeless tobacco. He reports that he does not drink alcohol or use drugs. Allergies  Allergies  Allergen Reactions  . Hectorol [Doxercalciferol] Other (See Comments)    unknown  . Iodinated Diagnostic Agents Other (See Comments)    unknown  . Iron Other (See Comments)    unknown  . Betadine [Povidone Iodine] Rash   Home medications Prior to Admission medications   Medication Sig Start Date End Date Taking? Authorizing Provider  ASPIRIN LOW DOSE 81 MG EC tablet Take 81 mg by mouth daily. 03/24/16  Yes Historical Provider, MD  atorvastatin (LIPITOR) 80 MG tablet Take 80 mg by mouth daily. 05/29/16  Yes Historical Provider, MD  losartan (COZAAR) 25 MG tablet Take 25 mg by mouth daily. 05/29/16  Yes Historical Provider, MD  midodrine (PROAMATINE) 10 MG tablet Take 10 mg by mouth 3 (three) times daily.  05/29/16  Yes Historical Provider, MD  pantoprazole (PROTONIX) 40 MG tablet Take 40 mg by mouth daily. 05/29/16  Yes Historical Provider, MD  RENVELA 800 MG tablet Take 1,600 mg by mouth 3 (three) times daily. 03/24/16  Yes Historical Provider, MD   Liver Function Tests  Recent Labs Lab 05/31/16 1822  AST 24  ALT 11*  ALKPHOS 95  BILITOT 2.0*  PROT 6.3*  ALBUMIN 2.9*   No results for input(s): LIPASE, AMYLASE in the last 168 hours. CBC  Recent Labs Lab 05/31/16 1822 06/01/16 0521  WBC 8.3 7.8  NEUTROABS 6.9  --   HGB 8.1* 7.9*  HCT 27.9* 26.8*  MCV 103.0* 102.7*  PLT 185 206   Basic Metabolic Panel  Recent Labs Lab 05/31/16 1822 06/01/16 0521  NA 138 138  K 4.6 3.9  CL 105 101  CO2 21* 28  GLUCOSE 82 97  BUN 27* 31*  CREATININE 4.60* 5.13*  CALCIUM 8.6* 8.3*  PHOS 4.7*  --    Iron/TIBC/Ferritin/ %Sat No results found for: IRON, TIBC,  FERRITIN, IRONPCTSAT  Vitals:   06/01/16 0400 06/01/16 0500 06/01/16 0600 06/01/16 0820  BP: (!) 64/52 (!) 78/57 (!) 68/48   Pulse: 100  99   Resp: 19 (!) 23 (!) 27   Temp: 98.9 F (37.2 C)   98.1 F (36.7 C)  TempSrc: Oral   Oral  SpO2: 100%  95%   Weight:  123.1 kg (271 lb 6.2 oz)    Height:       Exam Gen marked obesity, chron ill-appearing, not in distress No rash, cyanosis or gangrene Sclera anicteric, throat clear  +JVD Chest clear bilat, no rales or wheezing RRR loud 3/6 SEM throughout the upper chest, no RG Abd marked obestiy, poss some ascites too, nontender +bs GU normal male MS no joint effusions or deformity Ext 2+ bilat pitting LE edema / no wounds or ulcers Neuro is alert, Ox 3 , nf  CXR - bilat diffuse pulm edema, moderate severity Na 138 K 3.9  CO2 28  CA 8.3  Hb 7.9     HD [place]   TTS  5h  400/1.5   106kg  2/2.5 bath   Heparin 7000 w mid Rx 3000 Getting Mircera 200 ug due on Sat 7/29 Getting loading dose of iron   Assessment: 1  Severe AS 2  Pulm edema/ marked vol overload 3  Hypotension, prob not acute 4  ESRD TTS hd, on HD 6 yrs 5  MRSA +blood cx from outside hosp 6  Hist afib, in NSR here   7  Anemia of CKD, cont max ESA here, IV Fe loading   Plan - have asked [facility]VS to remove tunneled HD cath due to infection.  Needs CRRT for now, via temp cath. Have d/w primary team and cardiology.   Reginald Moselle MD [employer] pager [phone]    cell [phone] 06/01/2016, 11:01 AM

## 2016-06-01 NOTE — Progress Notes (Signed)
PULMONARY / CRITICAL CARE MEDICINE   Name: Reginald Fields MRN: [id number] DOB: [birth year]    ADMISSION DATE:  05/31/2016 CONSULTATION DATE:  05/31/2016  REFERRING MD:  [facility] MD  CHIEF COMPLAINT:  SOB  HISTORY OF PRESENT ILLNESS:   55 year old male with PMH as below, which includes known severe AS, COPD (on chronic 3L O2), atrial fibrillation, ESRD (on HD TTS), HTN, PAH, and bacteremia. He was admitted to [facility] with complaints of SOB and bilateral edema which have been progressive for 3 months. He has been more hypotensive recently which has limited HD to an extent. Also beat blockers have been stopped. He underwent HD 4/24 with removal of 1L. He also required 1 unit PRBC transfusion. HD limited by hypotension. 7/25 he was still having SOB and pain in the legs and remained hypotensive. He was transferred to ICU. SOB thought to be from systolic CHF secondary to severe AS. Patient is followed by CVTS in [place], so he has been transferred to [facility] for further evaluation.    SUBJECTIVE:  Did not sleep well overnight, tired this AM, but woke up and ate his breakfast. Remains hypotensive, but mental status remains intact.  (-) other issues.  BP is chronically low at 70/50 x 3 mos.    VITAL SIGNS: BP (!) 68/48   Pulse 99   Temp 98.1 F (36.7 C) (Oral)   Resp (!) 27   Ht  (1.753 m)   Wt 123.1 kg (271 lb 6.2 oz)   SpO2 95%   BMI 40.08 kg/m   HEMODYNAMICS:    VENTILATOR SETTINGS:    INTAKE / OUTPUT: I/O last 3 completed shifts: In: 460 [P.O.:460] Out: -   PHYSICAL EXAMINATION: General:  Obese male in NAD Neuro:  Alert, oriented, non-focal HEENT:  [place]/AT, PERRL, no appreciable  Cardiovascular:  RRR, 4/6 systolic murmur Lungs:  Distant, no wheeze. Effort normal. Some crackles at bases. R neck HD catheter > looks clean. No discharge seen Abdomen:  Soft, non-tender, non-distended Musculoskeletal:  No acute deformity or ROM limitation. Gr 2  edema.  Skin:  Grossly intact  LABS:  BMET  Recent Labs Lab 05/31/16 1822 06/01/16 0521  NA 138 138  K 4.6 3.9  CL 105 101  CO2 21* 28  BUN 27* 31*  CREATININE 4.60* 5.13*  GLUCOSE 82 97    Electrolytes  Recent Labs Lab 05/31/16 1822 06/01/16 0521  CALCIUM 8.6* 8.3*  MG 2.0  --   PHOS 4.7*  --     CBC  Recent Labs Lab 05/31/16 1822 06/01/16 0521  WBC 8.3 7.8  HGB 8.1* 7.9*  HCT 27.9* 26.8*  PLT 185 206    Coag's No results for input(s): APTT, INR in the last 168 hours.  Sepsis Markers  Recent Labs Lab 05/31/16 1822  LATICACIDVEN 1.8  PROCALCITON 0.91    ABG No results for input(s): PHART, PCO2ART, PO2ART in the last 168 hours.  Liver Enzymes  Recent Labs Lab 05/31/16 1822  AST 24  ALT 11*  ALKPHOS 95  BILITOT 2.0*  ALBUMIN 2.9*    Cardiac Enzymes No results for input(s): TROPONINI, PROBNP in the last 168 hours.  Glucose No results for input(s): GLUCAP in the last 168 hours.  Imaging Dg Chest Port 1 View  Result Date: 06/01/2016 CLINICAL DATA:  Shortness of breath. EXAM: PORTABLE CHEST 1 VIEW COMPARISON:  No recent prior. FINDINGS: Right IJ sheath noted with tip projected over right atrium. Cardiomegaly with diffuse  pulmonary infiltrates consistent pulmonary edema. Small bilateral effusions. No pneumothorax. IMPRESSION: 1. Right IJ sheath noted with tip projected over the right atrium. 2. Cardiomegaly with bilateral pulmonary infiltrates consistent pulmonary edema. Bilateral pleural effusions. Electronically Signed   By: Maisie Fus  Register   On: 06/01/2016 06:52    STUDIES:  Echo 7/7: LVEF 50-55%, PA pressures 72-91mmHg, severe AS, moderate MS, mod/sev TR.   CULTURES: MRSA in perm-cath from OSH Blood 7/25 > Perm-Cath culture 7/26 >  ANTIBIOTICS: Vanc 7/26 >   SIGNIFICANT EVENTS:   LINES/TUBES: RIJ perm cath   ASSESSMENT / PLAN:  PULMONARY A: Acute on chronic hypoxemic respiratory failure secondary to pulmonary  edema/volume overload in a setting of severe AS COPD without acute exacerbation  P:   Concern for infected perm catheter > plan to remove perm cath and do a 24 hr line holiday and insert a temporary HD cath in am> he does not appear in distress requiring CVVH today. Plan for CVVH tomorrow with new temporary HD cath.  Supplemental O2 Scheduled and PRN nebs Pulmonary hygiene  CARDIOVASCULAR A:  Acute on chronic systolic/diastolic CHF Severe aortic stenosis Hypotension > chronic per pt baseline SBP in 70s. (LA, cortisol WNL)  P:  Telemetry monitoring CVTS to see For RHC/LHC per cardiology who are following.  Echocardiogram EKG Will tolerate hypotension for now as mental status intact, consider pressors if worsen. Goal SBP >  RENAL A:   ESRD on HD TTS  P:   Plans for CRRT in am. As above re: perm cath. Will need a temporary HD cath 24 hrs after removing perm cath.   GASTROINTESTINAL A:   No acute issues  P:   Renal diet with fluid restriction Protonix for SUP  HEMATOLOGIC A:   Anemia of chronic illness  P:  Follow Heparin SQ for VTE ppx  INFECTIOUS A:   MRSA bacteremia considered. (+) in 1/2 bottles (this bottle from permcath)  P:   Start vanc.  Check blood cultures Plan to d/c perm cath, give a 24 hr line holiday and will need a temp HD cath in am.  Pt is a vasculopath.  Has had perm cath in L neck yrs ago.   ENDOCRINE A:   No acute issues   P:   Follow glucose on BMP  NEUROLOGIC A:   No acute issues  P:   monitor   FAMILY  - Updates: Patient updated bedside 7/26  - Inter-disciplinary family meet or Palliative Care meeting due by:  8/1   Joneen Roach, AGACNP-BC [facility] [facility] Pager [phone] or [phone]  06/01/2016 10:09 AM  ATTENDING NOTE / ATTESTATION NOTE :   I have discussed the case with the resident/APP  Joneen Roach.   I agree with the resident/APP's  history, physical examination,  assessment, and plans.    I have edited the above note and modified it according to our agreed history, physical examination, assessment and plan.   Family : No family at bedside. I updated the pt of the plan. Plan d/w Cardiology and renal services.    Pollie Meyer, MD 06/01/2016, 11:18 AM [place] [facility] Pager [phone] After 3 pm or if no answer, call [phone]

## 2016-06-02 ENCOUNTER — Inpatient Hospital Stay (HOSPITAL_COMMUNITY): Payer: Medicare Other

## 2016-06-02 ENCOUNTER — Encounter (HOSPITAL_COMMUNITY): Payer: Medicare Other

## 2016-06-02 DIAGNOSIS — D649 Anemia, unspecified: Secondary | ICD-10-CM

## 2016-06-02 DIAGNOSIS — N186 End stage renal disease: Secondary | ICD-10-CM

## 2016-06-02 LAB — CBC
HEMATOCRIT: 24.6 % — AB (ref 39.0–52.0)
Hemoglobin: 7.4 g/dL — ABNORMAL LOW (ref 13.0–17.0)
MCH: 30.6 pg (ref 26.0–34.0)
MCHC: 30.1 g/dL (ref 30.0–36.0)
MCV: 101.7 fL — AB (ref 78.0–100.0)
Platelets: 179 10*3/uL (ref 150–400)
RBC: 2.42 MIL/uL — AB (ref 4.22–5.81)
RDW: 19.8 % — AB (ref 11.5–15.5)
WBC: 6.1 10*3/uL (ref 4.0–10.5)

## 2016-06-02 LAB — RENAL FUNCTION PANEL
ALBUMIN: 2.6 g/dL — AB (ref 3.5–5.0)
ALBUMIN: 2.7 g/dL — AB (ref 3.5–5.0)
ANION GAP: 5 (ref 5–15)
Anion gap: 7 (ref 5–15)
BUN: 27 mg/dL — ABNORMAL HIGH (ref 6–20)
BUN: 26 mg/dL — ABNORMAL HIGH (ref 6–20)
CALCIUM: 8.6 mg/dL — AB (ref 8.9–10.3)
CHLORIDE: 102 mmol/L (ref 101–111)
CO2: 28 mmol/L (ref 22–32)
CO2: 27 mmol/L (ref 22–32)
CREATININE: 3.91 mg/dL — AB (ref 0.61–1.24)
Calcium: 8.2 mg/dL — ABNORMAL LOW (ref 8.9–10.3)
Chloride: 105 mmol/L (ref 101–111)
Creatinine, Ser: 4.15 mg/dL — ABNORMAL HIGH (ref 0.61–1.24)
GFR calc non Af Amer: 16 mL/min — ABNORMAL LOW (ref >60)
GFR, EST AFRICAN AMERICAN: 17 mL/min — AB (ref >60)
GFR, EST AFRICAN AMERICAN: 19 mL/min — AB (ref >60)
GFR, EST NON AFRICAN AMERICAN: 15 mL/min — AB (ref >60)
GLUCOSE: 124 mg/dL — AB (ref 65–99)
Glucose, Bld: 103 mg/dL — ABNORMAL HIGH (ref 65–99)
PHOSPHORUS: 4.2 mg/dL (ref 2.5–4.6)
PHOSPHORUS: 4 mg/dL (ref 2.5–4.6)
POTASSIUM: 3.9 mmol/L (ref 3.5–5.1)
Potassium: 3.8 mmol/L (ref 3.5–5.1)
SODIUM: 137 mmol/L (ref 135–145)
Sodium: 137 mmol/L (ref 135–145)

## 2016-06-02 LAB — MAGNESIUM: Magnesium: 2.1 mg/dL (ref 1.7–2.4)

## 2016-06-02 LAB — PROTIME-INR
INR: 1.47
Prothrombin Time: 17.9 s — ABNORMAL HIGH (ref 11.4–15.2)

## 2016-06-02 LAB — APTT: APTT: 37 s — AB (ref 24–36)

## 2016-06-02 MED ORDER — SODIUM CHLORIDE 0.9% FLUSH: 3.0000 mL | INTRAVENOUS | Status: DC | PRN

## 2016-06-02 MED ORDER — SODIUM CHLORIDE 0.9 % IV SOLN: INTRAVENOUS | Status: DC

## 2016-06-02 MED ORDER — ASPIRIN 81 MG PO CHEW
81.0000 mg | CHEWABLE_TABLET | ORAL | Status: AC
  Administered 2016-06-03: 81 mg via ORAL
  Filled 2016-06-02: qty 1

## 2016-06-02 MED ORDER — SODIUM CHLORIDE 0.9% FLUSH: 3.0000 mL | Freq: Two times a day (BID) | INTRAVENOUS | Status: DC

## 2016-06-02 MED ORDER — SODIUM CHLORIDE 0.9 % IV SOLN
1250.0000 mg | INTRAVENOUS | Status: DC
  Administered 2016-06-02 – 2016-06-05 (×4): 1250 mg via INTRAVENOUS
  Filled 2016-06-02 (×5): qty 1250

## 2016-06-02 MED ORDER — SODIUM CHLORIDE 0.9 % IV SOLN
250.0000 mL | INTRAVENOUS | Status: DC | PRN
Start: 2016-06-02 — End: 2016-06-03

## 2016-06-02 NOTE — Progress Notes (Signed)
Spoke to NP Candy Sledge with cards. NP said that it should be ok to use the purple pigtail on the HD cath for the PCI tomorrow, since the patient is a renal pt with multiple failed PIV attempts.

## 2016-06-02 NOTE — Progress Notes (Signed)
[facility] Progress Note   Subjective: tolerating UF at 125 cc/hr, no clotting issues  Vitals:   06/02/16 0800 06/02/16 0802 06/02/16 0900 06/02/16 1000  BP:  (!) 77/61 (!) 74/55 (!) 69/48  Pulse: (!) 108 (!) 54 95 (!) 101  Resp: 20 (!) 24 (!) 28 (!) 22  Temp:  97 F (36.1 C)    TempSrc:      SpO2: (!) 87% 90% 100% 99%  Weight:      Height:        Inpatient medications: . antiseptic oral rinse  7 mL Mouth Rinse BID  . arformoterol  15 mcg Nebulization BID  . [START ON 06/03/2016] aspirin  81 mg Oral Pre-Cath  . budesonide (PULMICORT) nebulizer solution  0.5 mg Nebulization BID  . Chlorhexidine Gluconate Cloth  6 each Topical Q0600  . heparin subcutaneous  5,000 Units Subcutaneous Q8H  . mupirocin ointment  1 application Nasal BID  . pantoprazole  40 mg Oral QHS  . sodium chloride flush  10-40 mL Intracatheter Q12H  . sodium chloride flush  3 mL Intravenous Q12H  . sodium chloride flush  3 mL Intravenous Q12H  . vancomycin  1,250 mg Intravenous Q24H   . [START ON 06/03/2016] sodium chloride    . dialysis replacement fluid (prismasate) 400 mL/hr at 06/02/16 0704  . dialysis replacement fluid (prismasate) 200 mL/hr at 06/01/16 1751  . dialysate (PRISMASATE) 2,000 mL/hr at 06/02/16 4098   sodium chloride, sodium chloride, sodium chloride, acetaminophen, alteplase, heparin, heparin, hydrocortisone, ondansetron (ZOFRAN) IV, sodium chloride flush, sodium chloride flush, sodium chloride flush  Exam: Gen marked obesity, chron ill-appearing, not in distress +JVD Chest clear bilat, no rales or wheezing RRR loud 3/6 SEM throughout the upper chest, no RG Abd marked obestiy, poss some ascites too, nontender +bs Ext 2+ bilat pitting LE edema / no wounds or ulcers Neuro is alert, Ox 3 , nf  CXR - bilat diffuse pulm edema, moderate severity Na 138 K 3.9  CO2 28  CA 8.3  Hb 7.9     HD [place]   TTS  5h  400/1.5   106kg  2/2.5 bath   Heparin 7000 w mid Rx  3000 Getting Mircera 200 ug due on Sat 7/29 Getting loading dose of iron   Assessment: 1  Severe AS - for heart cath tomorrow 2  Pulm edema/ marked vol overload - dec vol w CRRT 3  Hypotension, prob not acute 4  ESRD TTS hd, on HD 6 yrs 5  MRSA +blood cx from outside hosp- tunneled cath removed 7/26 6  Hist afib, in NSR here   7  Anemia of CKD, cont max ESA here, check Fe status   Plan - ^UF w CRRT 250-400/ hr   [name] MD [facility] pager [phone]    cell [phone] 06/02/2016, 10:49 AM    Recent Labs Lab 05/31/16 1822 06/01/16 0521 06/01/16 1612 06/02/16 0419  NA 138 138 138 137  K 4.6 3.9 4.1 3.9  CL 105 101 101 102  CO2 21* GLUCOSE 82 97 96 103*  BUN 27* 31* 34* 27*  CREATININE 4.60* 5.13* 5.35* 4.15*  CALCIUM 8.6* 8.3* 8.4* 8.2*  PHOS 4.7*  --  4.9* 4.2    Recent Labs Lab 05/31/16 1822 06/01/16 1612 06/02/16 0419  AST 24  --   --   ALT 11*  --   --   ALKPHOS 95  --   --  BILITOT 2.0*  --   --   PROT 6.3*  --   --   ALBUMIN 2.9* 2.6* 2.6*    Recent Labs Lab 05/31/16 1822 06/01/16 0521 06/02/16 0419  WBC 8.3 7.8 6.1  NEUTROABS 6.9  --   --   HGB 8.1* 7.9* 7.4*  HCT 27.9* 26.8* 24.6*  MCV 103.0* 102.7* 101.7*  PLT 185 206 179   Iron/TIBC/Ferritin/ %Sat No results found for: IRON, TIBC, FERRITIN, IRONPCTSAT

## 2016-06-02 NOTE — Progress Notes (Signed)
PULMONARY / CRITICAL CARE MEDICINE   Name: Reginald Fields MRN: [id number] DOB: Aug 19, 1961    ADMISSION DATE:  05/31/2016 CONSULTATION DATE:  05/31/2016  REFERRING MD:  [facility] MD  CHIEF COMPLAINT:  SOB  HISTORY OF PRESENT ILLNESS:   55 year old male with PMH as below, which includes known severe AS, COPD (on chronic 3L O2), atrial fibrillation, ESRD (on HD TTS), HTN, PAH, and bacteremia. He was admitted to [facility] with complaints of SOB and bilateral edema which have been progressive for 3 months. He has been more hypotensive recently which has limited HD to an extent. Also beat blockers have been stopped. He underwent HD 4/24 with removal of 1L. He also required 1 unit PRBC transfusion. HD limited by hypotension. 7/25 he was still having SOB and pain in the legs and remained hypotensive. He was transferred to ICU. SOB thought to be from systolic CHF secondary to severe AS. Patient is followed by CVTS in [place], so he has been transferred to [facility] for further evaluation.    SUBJECTIVE:   Tolerating CVVH. BP remains 70 systolic which is chronic for him.  No other subjective complaints.   VITAL SIGNS: BP (!) 69/48   Pulse (!) 101   Temp 97 F (36.1 C)   Resp (!) 22   Ht 5\' 9"  (1.753 m)   Wt 124 kg (273 lb 5.9 oz)   SpO2 99%   BMI 40.37 kg/m   HEMODYNAMICS:    VENTILATOR SETTINGS:    INTAKE / OUTPUT: I/O last 3 completed shifts: In: 930 [P.O.:840; I.V.:90] Out: 1148 [Other:1148]  PHYSICAL EXAMINATION: General:  Obese male in NAD. Comfortable. Tolerating CVVH Neuro:  Alert, oriented, not in distress. (-) lateralizing signs HEENT:  [place]/AT, PERRL, no appreciable  Cardiovascular:  RRR, 4/6 systolic murmur Lungs:  Distant, no wheeze. Effort normal. Some crackles at bases. L HD catheter in neck seen > looks clean Abdomen:  Soft, non-tender, non-distended Musculoskeletal:  No acute deformity or ROM limitation. Gr 2 edema.  Skin:  Grossly  intact  LABS:  BMET  Recent Labs Lab 06/01/16 0521 06/01/16 1612 06/02/16 0419  NA 138 138 137  K 3.9 4.1 3.9  CL 101 101 102  CO2 28 28 28   BUN 31* 34* 27*  CREATININE 5.13* 5.35* 4.15*  GLUCOSE 97 96 103*    Electrolytes  Recent Labs Lab 05/31/16 1822 06/01/16 0521 06/01/16 1612 06/02/16 0419  CALCIUM 8.6* 8.3* 8.4* 8.2*  MG 2.0  --   --  2.1  PHOS 4.7*  --  4.9* 4.2    CBC  Recent Labs Lab 05/31/16 1822 06/01/16 0521 06/02/16 0419  WBC 8.3 7.8 6.1  HGB 8.1* 7.9* 7.4*  HCT 27.9* 26.8* 24.6*  PLT 185 206 179    Coag's  Recent Labs Lab 06/02/16 0419  APTT 37*    Sepsis Markers  Recent Labs Lab 05/31/16 1822  LATICACIDVEN 1.8  PROCALCITON 0.91    ABG No results for input(s): PHART, PCO2ART, PO2ART in the last 168 hours.  Liver Enzymes  Recent Labs Lab 05/31/16 1822 06/01/16 1612 06/02/16 0419  AST 24  --   --   ALT 11*  --   --   ALKPHOS 95  --   --   BILITOT 2.0*  --   --   ALBUMIN 2.9* 2.6* 2.6*    Cardiac Enzymes No results for input(s): TROPONINI, PROBNP in the last 168 hours.  Glucose No results for input(s): GLUCAP in  the last 168 hours.  Imaging Dg Chest Port 1 View  Result Date: 06/02/2016 CLINICAL DATA:  Pulmonary edema, valvular heart disease, dialysis dependent renal failure. EXAM: PORTABLE CHEST 1 VIEW COMPARISON:  Portable chest x-ray of June 01, 2016 FINDINGS: The lungs are better inflated today. Confluent alveolar opacities persist greatest on the right. Small amounts of pleural fluid layer along the right lateral thoracic wall. The cardiac silhouette remains enlarged. The pulmonary vascularity remains engorged but is slightly more distinct. The left internal jugular venous catheter tip projects over the junction of the right and left brachiocephalic veins. IMPRESSION: Slight interval improvement in pulmonary interstitial and alveolar edema. Stable small right pleural effusion. Electronically Signed   By: David   [place] M.D.   On: 06/02/2016 07:07  Dg Chest Portable 1 View  Result Date: 06/01/2016 CLINICAL DATA:  Central line placement EXAM: PORTABLE CHEST 1 VIEW COMPARISON:  06/01/2016 FINDINGS: Diffuse interstitial thickening with patchy areas of alveolar airspace opacity. No pleural effusion or pneumothorax. Stable cardiomegaly. Left jugular dual-lumen central venous catheter with the tip at the confluence of the left brachiocephalic and SVC. No acute osseous abnormality. IMPRESSION: 1. Left jugular dual-lumen central venous catheter with the tip at the confluence of the left brachiocephalic and SVC. 2. Mild CHF. Electronically Signed   By: Elige Ko   On: 06/01/2016 17:26    STUDIES:  Echo 7/7: LVEF 50-55%, PA pressures 72-1mmHg, severe AS, moderate MS, mod/sev TR.   CULTURES: MRSA in perm-cath from OSH [facility] ) >  Blood 7/25 >   ANTIBIOTICS: Vanc 7/26 >   SIGNIFICANT EVENTS: 7/25 transfer from [place] for CVVH. Pt with severe valvular heart dse with volume overload and CKD 5.   LINES/TUBES: RIJ perm cath which was dc'd on 7/26 L IJ HD 7/26   ASSESSMENT / PLAN:  PULMONARY A: Acute on chronic hypoxemic respiratory failure secondary to pulmonary edema/volume overload in a setting of severe AS, MS, pulm HTN COPD without acute exacerbation  P:   On going CVVH > tolerating with BP 70 systolic (chronic) R perm cath dc'd on 7/26 with concern for line infxn > f/u cultures from OSH Supplemental O2 Scheduled and PRN nebs Pulmonary hygiene  CARDIOVASCULAR A:  Acute on chronic systolic/diastolic CHF Severe aortic stenosis, mitral stenosis, TR pulm HTN Hypotension > chronic per pt baseline SBP in 70s. (LA, cortisol WNL)  P:  Plan for LHC and RHC in am if he is able to lay flat. Will need to discuss/plan surgery after cath Telemetry monitoring Will tolerate hypotension for now as mental status intact, consider pressors if worsen. BP has been 70sys x 3 mos.   RENAL A:   ESRD  on HD TTS  P:   Cont CVVH. Renal following    GASTROINTESTINAL A:   No acute issues  P:   Renal diet with fluid restriction Protonix for SUP  HEMATOLOGIC A:   Anemia of chronic illness  P:  Follow Heparin SQ for VTE ppx  INFECTIOUS A:   MRSA bacteremia considered. (+) in 1/2 bottles (this bottle from permcath) from OSH  P:   Cont vanc.  F/U blood cultures from [facility] > told RN to f/u results.  Rpt blood culture from 7/25 (-) so far (peripherally)  ENDOCRINE A:   No acute issues   P:   Follow glucose on BMP  NEUROLOGIC A:   No acute issues  P:   monitor   FAMILY  - Updates: Patient updated bedside 7/26  -  Inter-disciplinary family meet or Palliative Care meeting due by:  8/1   J. Alexis Frock, MD 06/02/2016, 10:41 AM [place] [facility] Pager [phone] After 3 pm or if no answer, call [phone]

## 2016-06-02 NOTE — Progress Notes (Signed)
Subjective:  No chest pain. Still short of breath with any movement. Tolerating CRRT.  Objective:  Vital Signs in the last 24 hours: Temp:  [96.8 F (36 C)-98.4 F (36.9 C)] 97 F (36.1 C) (07/27 0802) Pulse Rate:  [25-106] 95 (07/27 0900) Resp:  [6-28] 28 (07/27 0900) BP: (58-91)/(41-65) 74/55 (07/27 0900) SpO2:  [42 %-100 %] 100 % (07/27 0900) Weight:  [273 lb 5.9 oz (124 kg)] 273 lb 5.9 oz (124 kg) (07/27 0500)  Intake/Output from previous day: 07/26 0701 - 07/27 0700 In: 690 [P.O.:600; I.V.:90] Out: 1148   Physical Exam: Pt is alert and oriented, NAD HEENT: normal Neck: JVP - elevated Lungs: Bibasilar crackles CV: RRR with grade 3/6 harsh late peaking systolic murmur at the right upper sternal border, grade 3/6 holosystolic murmur at the left lower sternal border Abd: soft, NT, Positive BS, no hepatomegaly Ext: Diffuse 1+ edema in the arms and legs   Lab Results:  Recent Labs  06/01/16 0521 06/02/16 0419  WBC 7.8 6.1  HGB 7.9* 7.4*  PLT 206 179    Recent Labs  06/01/16 1612 06/02/16 0419  NA 138 137  K 4.1 3.9  CL 101 102  CO2 28 28  GLUCOSE 96 103*  BUN 34* 27*  CREATININE 5.35* 4.15*   No results for input(s): TROPONINI in the last 72 hours.  Invalid input(s): CK, MB  Cardiac Studies: 2D Echo: Study Conclusions  - Left ventricle: The cavity size was normal. There was severe   concentric hypertrophy. Systolic function was vigorous. The   estimated ejection fraction was in the range of 65% to 70%. Wall   motion was normal; there were no regional wall motion   abnormalities. - Ventricular septum: Septal motion showed paradox. The contour   showed diastolic flattening. - Aortic valve: Valve mobility was restricted. There was severe   stenosis. There was mild regurgitation. Peak velocity (S): 478   cm/s. Mean gradient (S): 47 mm Hg. Valve area (VTI): 0.83 cm^2.   Valve area (Vmax): 0.69 cm^2. Valve area (Vmean): 0.68 cm^2. - Mitral  valve: Severely calcified annulus. Severely thickened,   severely calcified leaflets . Mobility was restricted. The   findings are consistent with severe stenosis. Mean gradient (D):   14 mm Hg. Valve area by continuity equation (using LVOT flow):   1.07 cm^2. - Left atrium: The atrium was severely dilated. - Right ventricle: The cavity size was moderately dilated. Wall   thickness was normal. Systolic function was severely reduced. - Right atrium: The atrium was severely dilated. - Tricuspid valve: There was severe regurgitation. - Pulmonary arteries: Systolic pressure was severely increased. PA   peak pressure: 82 mm Hg (S).  Recommendations:  This procedure has been discussed with the referring physician.  Tele: Normal sinus rhythm  Assessment/Plan:  1. Hypotension/Cardiogenic shock, acute on chronic 2. Acute on chronic diastolic heart failure, NYHA functional class IV 3. Probable severe aortic stenosis 4. End-stage renal disease 5. Anemia, probable acute on chronic with hemoglobin documented at 6.9 on July 23 6. Elevated troponin likely demand ischemia 7. Recent upper extremity DVT  8. Severe mitral stenosis 9. Severe tricuspid regurgitation 10. Severe pulmonary hypertension  The patient has very complex cardiac disease with both severe aortic and mitral valve stenosis, severe tricuspid regurgitation, and severe pulmonary hypertension which is likely secondary to his left heart disease. He has been chronically volume overloaded because of end-stage renal disease and hypotension precluding volume removal. Fortunately he is tolerating  CRRT fairly well. He will require right and left heart catheterization but is unable to lie flat today. Will tentatively schedule him for tomorrow after another 24 hours of fluid removal. Will assess him in the morning and decide if he is able to undergo right and left heart catheterization. He will require cardiac surgical evaluation and  multidisciplinary discussion about his treatment options.  Tonny Bollman, M.D. 06/02/2016, 9:24 AM

## 2016-06-02 NOTE — Progress Notes (Signed)
Pharmacy Antibiotic Note  Reginald Fields is a 55 y.o. male admitted on 05/31/2016 with MRSA bacteremia 1/2? (from perm cath) at [facility].  Pharmacy has been consulted for vancomycin dosing. Repeating cultures, d/c perm cath and temp HD cath placed. ESRD on HD TTS, transitioned to CRRT on 7/26 at ~1800 and tolerating well. Afeb, wbc wnl.  Pt received 1750mg  loading dose of vanc at [facility] 7/25 and given additional 750mg  IV x1 at [facility] on 7/26 (to equal 2500mg  loading dose for HD).  Plan: Vanc 1250mg  (~10mg /kg) IV q24h - start at 1500 today Monitor c/s, LOT, CRRT plans/toleration Check VR prior to 7/28 dose if CRRT continues                         Temp (24hrs), Avg:97.3 F (36.3 C), Min:96.8 F (36 C), Max:98.4 F (36.9 C)   Recent Labs Lab 05/31/16 1822 06/01/16 0521 06/01/16 1612 06/02/16 0419  WBC 8.3 7.8  --  6.1  CREATININE 4.60* 5.13* 5.35* 4.15*  LATICACIDVEN 1.8  --   --   --     Estimated Creatinine Clearance: 26.8 mL/min (by C-G formula based on SCr of 4.15 mg/dL).    Allergies  Allergen Reactions  . Hectorol [Doxercalciferol] Other (See Comments)    unknown  . Iodinated Diagnostic Agents Other (See Comments)    unknown  . Iron Other (See Comments)    unknown  . Betadine [Povidone Iodine] Rash    Antimicrobials this admission: 7/25 vanc (started at OSH) >> Zosyn at OSH  Dose adjustments this admission:  Microbiology results: MRSA in perm-cath from OSH Blood 7/25 > ngtd 7/25 mrsa pcr: +   Babs Bertin, PharmD, [facility] Clinical Pharmacist Pager [phone] 06/02/2016 9:20 AM

## 2016-06-03 ENCOUNTER — Inpatient Hospital Stay (HOSPITAL_COMMUNITY): Payer: Medicare Other

## 2016-06-03 ENCOUNTER — Ambulatory Visit (HOSPITAL_COMMUNITY)
Admission: RE | Admit: 2016-06-03 | Payer: Medicare Other | Source: Ambulatory Visit | Admitting: Interventional Cardiology

## 2016-06-03 ENCOUNTER — Encounter (HOSPITAL_COMMUNITY)
Admission: AD | Disposition: A | Discharge status: Hospice | Payer: Self-pay | Source: Other Acute Inpatient Hospital | Attending: Internal Medicine

## 2016-06-03 ENCOUNTER — Encounter (HOSPITAL_COMMUNITY): Payer: Medicare Other

## 2016-06-03 LAB — RENAL FUNCTION PANEL
ANION GAP: 7 (ref 5–15)
Albumin: 2.8 g/dL — ABNORMAL LOW (ref 3.5–5.0)
Albumin: 2.8 g/dL — ABNORMAL LOW (ref 3.5–5.0)
Anion gap: 8 (ref 5–15)
BUN: 22 mg/dL — AB (ref 6–20)
BUN: 22 mg/dL — ABNORMAL HIGH (ref 6–20)
CALCIUM: 9.3 mg/dL (ref 8.9–10.3)
CHLORIDE: 102 mmol/L (ref 101–111)
CO2: 27 mmol/L (ref 22–32)
CO2: 26 mmol/L (ref 22–32)
CREATININE: 3.28 mg/dL — AB (ref 0.61–1.24)
Calcium: 9.5 mg/dL (ref 8.9–10.3)
Chloride: 101 mmol/L (ref 101–111)
Creatinine, Ser: 2.94 mg/dL — ABNORMAL HIGH (ref 0.61–1.24)
GFR calc Af Amer: 23 mL/min — ABNORMAL LOW (ref >60)
GFR calc non Af Amer: 20 mL/min — ABNORMAL LOW (ref >60)
GFR calc non Af Amer: 23 mL/min — ABNORMAL LOW (ref >60)
GFR, EST AFRICAN AMERICAN: 26 mL/min — AB (ref >60)
GLUCOSE: 112 mg/dL — AB (ref 65–99)
Glucose, Bld: 149 mg/dL — ABNORMAL HIGH (ref 65–99)
Phosphorus: 3.6 mg/dL (ref 2.5–4.6)
Phosphorus: 4.2 mg/dL (ref 2.5–4.6)
Potassium: 4 mmol/L (ref 3.5–5.1)
Potassium: 4.5 mmol/L (ref 3.5–5.1)
SODIUM: 136 mmol/L (ref 135–145)
Sodium: 135 mmol/L (ref 135–145)

## 2016-06-03 LAB — CBC
HCT: 24.9 % — ABNORMAL LOW (ref 39.0–52.0)
HEMOGLOBIN: 7.5 g/dL — AB (ref 13.0–17.0)
MCH: 30.7 pg (ref 26.0–34.0)
MCHC: 30.1 g/dL (ref 30.0–36.0)
MCV: 102 fL — AB (ref 78.0–100.0)
Platelets: 171 10*3/uL (ref 150–400)
RBC: 2.44 MIL/uL — AB (ref 4.22–5.81)
RDW: 19.8 % — AB (ref 11.5–15.5)
WBC: 7.5 10*3/uL (ref 4.0–10.5)

## 2016-06-03 LAB — PROTIME-INR
INR: 1.47
Prothrombin Time: 18 seconds — ABNORMAL HIGH (ref 11.4–15.2)

## 2016-06-03 LAB — TROPONIN I: TROPONIN I: 0.07 ng/mL — AB (ref <0.03)

## 2016-06-03 LAB — IRON AND TIBC
Iron: 40 ug/dL — ABNORMAL LOW (ref 45–182)
SATURATION RATIOS: 14 % — AB (ref 17.9–39.5)
TIBC: 293 ug/dL (ref 250–450)
UIBC: 253 ug/dL

## 2016-06-03 LAB — VANCOMYCIN, RANDOM: VANCOMYCIN RM: 10

## 2016-06-03 LAB — APTT: aPTT: 37 seconds — ABNORMAL HIGH (ref 24–36)

## 2016-06-03 LAB — MAGNESIUM: Magnesium: 2.3 mg/dL (ref 1.7–2.4)

## 2016-06-03 SURGERY — INVASIVE LAB ABORTED CASE

## 2016-06-03 MED ORDER — LIDOCAINE HCL (PF) 1 % IJ SOLN
INTRAMUSCULAR | Status: AC
  Filled 2016-06-03: qty 30

## 2016-06-03 MED ORDER — IOPAMIDOL (ISOVUE-370) INJECTION 76%
INTRAVENOUS | Status: AC
Start: 2016-06-03 — End: 2016-06-03
  Filled 2016-06-03: qty 100

## 2016-06-03 MED ORDER — DIPHENHYDRAMINE HCL 50 MG/ML IJ SOLN
INTRAMUSCULAR | Status: DC | PRN
  Administered 2016-06-03: 25 mg via INTRAVENOUS

## 2016-06-03 MED ORDER — DIPHENHYDRAMINE HCL 25 MG PO CAPS
25.0000 mg | ORAL_CAPSULE | Freq: Once | ORAL | Status: AC
  Administered 2016-06-03: 25 mg via ORAL
  Filled 2016-06-03: qty 1

## 2016-06-03 MED ORDER — TRAMADOL HCL 50 MG PO TABS
50.0000 mg | ORAL_TABLET | Freq: Two times a day (BID) | ORAL | Status: DC | PRN
  Administered 2016-06-03: 50 mg via ORAL
  Filled 2016-06-03: qty 1

## 2016-06-03 MED ORDER — DIPHENHYDRAMINE HCL 50 MG/ML IJ SOLN
INTRAMUSCULAR | Status: AC
  Filled 2016-06-03: qty 1

## 2016-06-03 MED ORDER — HEPARIN (PORCINE) IN NACL 2-0.9 UNIT/ML-% IJ SOLN
INTRAMUSCULAR | Status: AC
  Filled 2016-06-03: qty 1000

## 2016-06-03 MED ORDER — METHYLPREDNISOLONE SODIUM SUCC 125 MG IJ SOLR
INTRAMUSCULAR | Status: DC | PRN
  Administered 2016-06-03: 125 mg via INTRAVENOUS

## 2016-06-03 MED ORDER — DARBEPOETIN ALFA 200 MCG/0.4ML IJ SOSY
200.0000 ug | PREFILLED_SYRINGE | INTRAMUSCULAR | Status: DC
  Administered 2016-06-04: 200 ug via INTRAVENOUS
  Filled 2016-06-03 (×2): qty 0.4

## 2016-06-03 MED ORDER — METHYLPREDNISOLONE SODIUM SUCC 125 MG IJ SOLR
INTRAMUSCULAR | Status: AC
  Filled 2016-06-03: qty 2

## 2016-06-03 SURGICAL SUPPLY — 8 items
CATH SWAN GANZ 7F STRAIGHT (CATHETERS) IMPLANT
HOVERMATT SINGLE USE (MISCELLANEOUS) ×4 IMPLANT
KIT HEART LEFT (KITS) IMPLANT
PACK CARDIAC CATHETERIZATION (CUSTOM PROCEDURE TRAY) IMPLANT
SHEATH PINNACLE 5F 10CM (SHEATH) IMPLANT
SHEATH PINNACLE 7F 10CM (SHEATH) IMPLANT
TRANSDUCER W/STOPCOCK (MISCELLANEOUS) IMPLANT
WIRE EMERALD 3MM-J .035X150CM (WIRE) IMPLANT

## 2016-06-03 NOTE — Progress Notes (Signed)
[facility] Progress Note   Subjective: tolerating UF at 350-400 cc/ hr, 7.5 L off yest  Vitals:   06/03/16 0758 06/03/16 0800 06/03/16 0900 06/03/16 0954  BP:  (!) 71/49 (!) 70/47   Pulse: (!) 101     Resp: (!) 23 (!) 24 16   Temp:  98.1 F (36.7 C)    TempSrc:  Oral    SpO2: 96%   100%  Weight:      Height:        Inpatient medications: . [MAR Hold] antiseptic oral rinse  7 mL Mouth Rinse BID  . [MAR Hold] arformoterol  15 mcg Nebulization BID  . [MAR Hold] budesonide (PULMICORT) nebulizer solution  0.5 mg Nebulization BID  . [MAR Hold] Chlorhexidine Gluconate Cloth  6 each Topical Q0600  . [MAR Hold] heparin subcutaneous  5,000 Units Subcutaneous Q8H  . [MAR Hold] mupirocin ointment  1 application Nasal BID  . [MAR Hold] pantoprazole  40 mg Oral QHS  . [MAR Hold] sodium chloride flush  10-40 mL Intracatheter Q12H  . [MAR Hold] sodium chloride flush  3 mL Intravenous Q12H  . sodium chloride flush  3 mL Intravenous Q12H  . [MAR Hold] vancomycin  1,250 mg Intravenous Q24H   . sodium chloride    . dialysis replacement fluid (prismasate) 400 mL/hr at 06/03/16 0937  . dialysis replacement fluid (prismasate) 200 mL/hr at 06/02/16 2023  . dialysate (PRISMASATE) 2,000 mL/hr at 06/03/16 7471   [facility] Hold] sodium chloride, [MAR Hold] sodium chloride, sodium chloride, [MAR Hold] acetaminophen, [MAR Hold] alteplase, [MAR Hold] heparin, [MAR Hold] heparin, [MAR Hold] hydrocortisone, [MAR Hold] ondansetron (ZOFRAN) IV, [MAR Hold] sodium chloride flush, [MAR Hold] sodium chloride flush, sodium chloride flush  Exam: Gen marked obesity, chron ill-appearing, not in distress +JVD Chest clear bilat, no rales or wheezing RRR loud 3/6 SEM throughout the upper chest, no RG Abd obese, nontender +bs Ext 2+ bilat pitting LE edema, 1+ UE edema, facial edema resolved Neuro is alert, Ox 3 , nf  CXR 7/26 - bilat diffuse pulm edema, moderate severity CXR 7/27 - slight improvement  in CHF CXR 7/28 - pending  HD [place]   TTS  5h  400/1.5   106kg  2/2.5 bath   Heparin 7000 w mid Rx 3000 Getting Mircera 200 ug due on Sat 7/29 Getting loading dose of iron   Assessment: 1  Severe AS - for heart cath today 2  Pulm edema/severe vol overload - decreasing vol w CRRT, repeat film pending 3  Hypotension, looks chronic/ subacute at min 4  ESRD TTS hd, on HD 6 yrs. Has temp cath in now for HD. 5  MRSA +blood cx from outside hosp- tunneled cath removed 7/26 6  Hist afib, in NSR here   7  Anemia of CKD, cont max ESA here, check Fe status   Plan - cont CRRT w UF 250-400/ hr, no heparin.    Reginald Moselle MD [facility] pager [phone]    cell [phone] 06/03/2016, 10:39 AM    Recent Labs Lab 06/02/16 0419 06/02/16 1509 06/03/16 0423  NA 137 137 136  K 3.9 3.8 4.0  CL 102 105 101  CO2 28 27 27   GLUCOSE 103* 124* 112*  BUN 27* 26* 22*  CREATININE 4.15* 3.91* 3.28*  CALCIUM 8.2* 8.6* 9.3  PHOS 4.2 4.0 3.6    Recent Labs Lab 05/31/16 1822  06/02/16 0419 06/02/16 1509 06/03/16 0423  AST 24  --   --   --   --  ALT 11*  --   --   --   --   ALKPHOS 95  --   --   --   --   BILITOT 2.0*  --   --   --   --   PROT 6.3*  --   --   --   --   ALBUMIN 2.9*  < > 2.6* 2.7* 2.8*  < > = values in this interval not displayed.  Recent Labs Lab 05/31/16 1822 06/01/16 0521 06/02/16 0419 06/03/16 0542  WBC 8.3 7.8 6.1 7.5  NEUTROABS 6.9  --   --   --   HGB 8.1* 7.9* 7.4* 7.5*  HCT 27.9* 26.8* 24.6* 24.9*  MCV 103.0* 102.7* 101.7* 102.0*  PLT 185 206 179 171   Iron/TIBC/Ferritin/ %Sat No results found for: IRON, TIBC, FERRITIN, IRONPCTSAT

## 2016-06-03 NOTE — Plan of Care (Addendum)
Cardiology [place]  I was called by the nurse regarding borderline troponin elevation (0.07) that was actually improved compared to the OSH (0.23 from 05/30/16).  This had been ordered to evaluate an episode of CP earlier in the evening that resolved with Tramadol by the time I spoke with the nurse.  Notably, there were inferolateral TWI during the time of his CP.  I ordered a follow-up EKG that demonstrated slight improvement in these TWI with resolution of CP.  He had been pending a cath today that was deferred due to an emergent case.  Will continue to trend enzymes overnight & monitor for recurrent CP.  Will make NPO after midnight in case of the need for more urgent cardiac catheterization tomorrow.  Lance Morin, MD

## 2016-06-03 NOTE — Progress Notes (Signed)
ANTIBIOTIC CONSULT NOTE - INITIAL  Pharmacy Consult for vancomycin Indication: MRSA bacteremia 1/2 at [facility]  Allergies  Allergen Reactions  . Hectorol [Doxercalciferol] Other (See Comments)    unknown  . Iodinated Diagnostic Agents Other (See Comments)    unknown  . Iron Other (See Comments)    unknown  . Betadine [Povidone Iodine] Rash    Patient Measurements: Height: 5\' 9"  (175.3 cm) Weight: 257 lb 0.9 oz (116.6 kg) IBW/kg (Calculated) : 70.7 Adjusted Body Weight:   Vital Signs: Temp: 98.6 F (37 C) (07/28 1100) Temp Source: Oral (07/28 1100) BP: 71/50 (07/28 1300) Pulse Rate: 101 (07/28 0758) Intake/Output from previous day: 07/27 0701 - 07/28 0700 In: 641 [P.O.:462; IV Piggyback:167] Out: 8063  Intake/Output from this shift: Total I/O In: 120 [P.O.:120] Out: 2677 [Other:2677]  Labs:  Recent Labs  06/01/16 0521  06/02/16 0419 06/02/16 1509 06/03/16 0423 06/03/16 0542  WBC 7.8  --  6.1  --   --  7.5  HGB 7.9*  --  7.4*  --   --  7.5*  PLT 206  --  179  --   --  171  CREATININE 5.13*  < > 4.15* 3.91* 3.28*  --   < > = values in this interval not displayed. Estimated Creatinine Clearance: 32.8 mL/min (by C-G formula based on SCr of 3.28 mg/dL).  Recent Labs  06/03/16 1518  VANCORANDOM 10     Microbiology: Recent Results (from the past 720 hour(s))  Culture, blood (routine x 2)     Status: None (Preliminary result)   Collection Time: 05/31/16  6:10 PM  Result Value Ref Range Status   Specimen Description BLOOD RIGHT ANTECUBITAL  Final   Special Requests IN PEDIATRIC BOTTLE .25CC  Final   Culture NO GROWTH 3 DAYS  Final   Report Status PENDING  Incomplete  Culture, blood (routine x 2)     Status: None (Preliminary result)   Collection Time: 05/31/16  6:22 PM  Result Value Ref Range Status   Specimen Description BLOOD LEFT ANTECUBITAL  Final   Special Requests IN PEDIATRIC BOTTLE  .25CC  Final   Culture NO GROWTH 3 DAYS  Final   Report Status PENDING  Incomplete  MRSA PCR Screening     Status: Abnormal   Collection Time: 05/31/16  6:59 PM  Result Value Ref Range Status   MRSA by PCR POSITIVE (A) NEGATIVE Final    Comment:        The GeneXpert MRSA Assay (FDA approved for NASAL specimens only), is one component of a comprehensive MRSA colonization surveillance program. It is not intended to diagnose MRSA infection nor to guide or monitor treatment for MRSA infections. RESULT CALLED TO, READ BACK BY AND VERIFIED WITH: Lennette Bihari RN [id number] 05/31/16 A BROWNING     Medical History: Past Medical History:  Diagnosis Date  . A-fib (HCC)    with a CHADS-VASC of 2  . Aortic stenosis 05/24/2016  . CAD (coronary artery disease)    non obstructive   . End stage renal disease (HCC)    on Hemodialysis    . Hyperlipidemia   . Hypertension   . Mitral insufficiency 05/24/2016  . Mitral regurgitation   . Pulmonary hypertension (HCC)   . Severe anemia    due to recurrent GI bleeds  . Staphylococcus aureus bacteremia     Medications:  Scheduled:  . antiseptic oral rinse  7 mL Mouth Rinse BID  . arformoterol  15 mcg Nebulization  BID  . budesonide (PULMICORT) nebulizer solution  0.5 mg Nebulization BID  . Chlorhexidine Gluconate Cloth  6 each Topical Q0600  . [START ON 06/04/2016] darbepoetin (ARANESP) injection - DIALYSIS  200 mcg Intravenous Q Sat-HD  . heparin subcutaneous  5,000 Units Subcutaneous Q8H  . mupirocin ointment  1 application Nasal BID  . pantoprazole  40 mg Oral QHS  . sodium chloride flush  10-40 mL Intracatheter Q12H  . sodium chloride flush  3 mL Intravenous Q12H  . vancomycin  1,250 mg Intravenous Q24H   Infusions:  . dialysis replacement fluid (prismasate) 400 mL/hr at 06/03/16 0937  . dialysis replacement fluid (prismasate) 200 mL/hr at 06/02/16 2023  . dialysate (PRISMASATE) 2,000 mL/hr at 06/03/16 1610   Assessment: 55 yo male with MRSA bacteremia 1/2 at [facility] is currently on  vancomycin therapy.  Only has had the 1250 mg dose x1 before a vancomycin random level (which is 10) is drawn.  On CRRT  Goal of Therapy:  Vancomycin trough level 15-20 mcg/ml  Plan:  - continue vancomycin 1250 mg iv q24h for now - follow on CRRT - if continue to be on CRRT and on vanc, recheck trough at steady state  Trenee Igoe, Tsz-Yin 06/03/2016,4:12 PM

## 2016-06-03 NOTE — Progress Notes (Signed)
Patient started having 7/10 mid sternal chest pain at 19:18. Vital signs stable. EKG was done, MD notified, and new order for serial troponin added. Tramadol 50mg  ordered and given at 20:05. Patient was free of chest pain at 21:04. Troponin at 19:58 was 0.07. MD made aware of troponin and repeat EKG done at 21:52 while patient was absent of chest pain. Will continue to monitor.   *Troponin level at [facility] prior to arrival at [facility]: 05/29/16 23:45  Troponin  0.230 05/30/16 02:40  Troponin  0.228 05/30/16 05:15  Troponin  0.220

## 2016-06-03 NOTE — Progress Notes (Signed)
    Subjective:  Feeling a little better. No CP. No dyspnea at rest.   Objective:  Vital Signs in the last 24 hours: Temp:  [95.5 F (35.3 C)-98.9 F (37.2 C)] 98.1 F (36.7 C) (07/28 0800) Pulse Rate:  [93-102] 101 (07/28 0758) Resp:  [11-28] 24 (07/28 0800) BP: (57-77)/(38-61) 71/49 (07/28 0800) SpO2:  [80 %-100 %] 96 % (07/28 0758) Weight:  [257 lb 0.9 oz (116.6 kg)] 257 lb 0.9 oz (116.6 kg) (07/28 0624)  Intake/Output from previous day: 07/27 0701 - 07/28 0700 In: 641 [P.O.:462; IV Piggyback:167] Out: 8063   Physical Exam: Pt is alert and oriented, obese male in NAD HEENT: normal Neck: JVP - elevated Lungs: rales bilaterally CV: RRR with 4/6 harsh systolic murmur best heard at the apex Abd: soft, NT, Positive BS, no hepatomegaly Ext: diffuse 1+ edema Skin: warm/dry no rash   Lab Results:  Recent Labs  06/02/16 0419 06/03/16 0542  WBC 6.1 7.5  HGB 7.4* 7.5*  PLT 179 171    Recent Labs  06/02/16 1509 06/03/16 0423  NA 137 136  K 3.8 4.0  CL 105 101  CO2 27 27  GLUCOSE 124* 112*  BUN 26* 22*  CREATININE 3.91* 3.28*   No results for input(s): TROPONINI in the last 72 hours.  Invalid input(s): CK, MB  Cardiac Studies: Echo reviewed  Tele: Sinus rhythm  Assessment/Plan:  1. Hypotension/Cardiogenic shock, acute on chronic 2. Acute on chronic diastolic heart failure, NYHA functional class IV 3. Severe aortic stenosis 4. End-stage renal disease 5. Anemia, probable acute on chronic with hemoglobin documented at 6.9 on July 23, 7.5 mg/dL today 6. Elevated troponin likely demand ischemia 7. Recent upper extremity DVT  8. Severe mitral stenosis 9. Severe tricuspid regurgitation 10. Severe pulmonary hypertension  Plan right and left heart cath today. I have reviewed the risks, indications, and alternatives to cardiac catheterization, possible angioplasty, and stenting with the patient. Risks include but are not limited to bleeding, infection,  vascular injury, stroke, myocardial infection, arrhythmia, kidney injury, radiation-related injury in the case of prolonged fluoroscopy use, emergency cardiac surgery, and death. The patient understands the risks of serious complication is 1-2 in 1000 with diagnostic cardiac cath and 1-2% or less with angioplasty/stenting. Right groin access. Pt thinks he can lie flat for the procedure as long he has supplemental O2.   Cardiac surgical consultation pending post-cath once all data is complete. He may require triple valve surgery and he will be at extremely high-risk with his severe comorbid conditions and debility.  Tonny Bollman, M.D. 06/03/2016, 8:58 AM

## 2016-06-03 NOTE — Progress Notes (Signed)
PULMONARY / CRITICAL CARE MEDICINE   Name: Reginald Fields MRN: [id number] DOB: Nov 30, 1960    ADMISSION DATE:  05/31/2016 CONSULTATION DATE:  05/31/2016  REFERRING MD:  [facility] MD  CHIEF COMPLAINT:  SOB  HISTORY OF PRESENT ILLNESS:   55 year old male with PMH as below, which includes known severe AS, COPD (on chronic 3L O2), atrial fibrillation, ESRD (on HD TTS), HTN, PAH, and bacteremia. He was admitted to [facility] with complaints of SOB and bilateral edema which have been progressive for 3 months. He has been more hypotensive recently which has limited HD to an extent. Also beat blockers have been stopped. He underwent HD 4/24 with removal of 1L. He also required 1 unit PRBC transfusion. HD limited by hypotension. 7/25 he was still having SOB and pain in the legs and remained hypotensive. He was transferred to ICU. SOB thought to be from systolic CHF secondary to severe AS. Patient is followed by CVTS in [place], so he has been transferred to [facility] for further evaluation.    SUBJECTIVE:   Tolerating CVVH. BP remains 70 systolic which is chronic for him.  No other subjective complaints.  (-) 7.8 L since admission  VITAL SIGNS: BP (!) 70/47   Pulse (!) 101   Temp 98.1 F (36.7 C) (Oral)   Resp 16   Ht  (1.753 m)   Wt 116.6 kg (257 lb 0.9 oz)   SpO2 96%   BMI 37.96 kg/m   HEMODYNAMICS:    VENTILATOR SETTINGS:    INTAKE / OUTPUT: I/O last 3 completed shifts: In: 841 [P.O.:582; I.V.:80; Other:12; IV Piggyback:167] Out: 9140 [Other:9140]  PHYSICAL EXAMINATION: General:  Obese male in NAD. Comfortable. Tolerating CVVH Neuro:  Alert, oriented, not in distress. (-) lateralizing signs HEENT:  [place]/AT, PERRL, no appreciable  Cardiovascular:  RRR, 4/6 systolic murmur Lungs:  Distant, no wheeze. Effort normal. Some crackles at bases. L HD catheter in neck seen > looks clean Abdomen:  Soft, non-tender, non-distended Musculoskeletal:  No acute  deformity or ROM limitation. Edema less since admission.  Skin:  Grossly intact  LABS:  BMET  Recent Labs Lab 06/02/16 0419 06/02/16 1509 06/03/16 0423  NA 137 137 136  K 3.9 3.8 4.0  CL 102 105 101  CO2 BUN 27* 26* 22*  CREATININE 4.15* 3.91* 3.28*  GLUCOSE 103* 124* 112*    Electrolytes  Recent Labs Lab 05/31/16 1822  06/02/16 0419 06/02/16 1509 06/03/16 0422 06/03/16 0423  CALCIUM 8.6*  < > 8.2* 8.6*  --  9.3  MG 2.0  --  2.1  --  2.3  --   PHOS 4.7*  < > 4.2 4.0  --  3.6  < > = values in this interval not displayed.  CBC  Recent Labs Lab 06/01/16 0521 06/02/16 0419 06/03/16 0542  WBC 7.8 6.1 7.5  HGB 7.9* 7.4* 7.5*  HCT 26.8* 24.6* 24.9*  PLT 206 179 171    Coag's  Recent Labs Lab 06/02/16 0419 06/02/16 1508 06/03/16 0422 06/03/16 0517  APTT 37*  --  37*  --   INR  --  1.47  --  1.47    Sepsis Markers  Recent Labs Lab 05/31/16 1822  LATICACIDVEN 1.8  PROCALCITON 0.91    ABG No results for input(s): PHART, PCO2ART, PO2ART in the last 168 hours.  Liver Enzymes  Recent Labs Lab 05/31/16 1822  06/02/16 0419 06/02/16 1509 06/03/16 0423  AST 24  --   --   --   --  ALT 11*  --   --   --   --   ALKPHOS 95  --   --   --   --   BILITOT 2.0*  --   --   --   --   ALBUMIN 2.9*  < > 2.6* 2.7* 2.8*  < > = values in this interval not displayed.  Cardiac Enzymes No results for input(s): TROPONINI, PROBNP in the last 168 hours.  Glucose No results for input(s): GLUCAP in the last 168 hours.  Imaging No results found.   STUDIES:  Echo 7/7: LVEF 50-55%, PA pressures 72-71mmHg, severe AS, moderate MS, mod/sev TR.   CULTURES: Blood culture from perm-cath from OSH [facility] ) > (-) so far as of 7/27 Blood 7/25 > (-) MRSA 7/25 (+)  ANTIBIOTICS: Vanc 7/26 >   SIGNIFICANT EVENTS: 7/25 transfer from [place] for CVVH. Pt with severe valvular heart dse with volume overload and CKD 5.   LINES/TUBES: RIJ perm cath which  was dc'd on 7/26 L IJ HD 7/26   ASSESSMENT / PLAN:  PULMONARY A: Acute on chronic hypoxemic respiratory failure secondary to pulmonary edema/volume overload in a setting of severe AS, MS, pulm HTN COPD without acute exacerbation  P:   On going CVVH > tolerating with BP 70 systolic (chronic). (-) 8L so far.  R perm cath dc'd on 7/26 with concern for line infxn > cultures so far (-) Supplemental O2 Scheduled and PRN nebs Pulmonary hygiene  CARDIOVASCULAR A:  Acute on chronic systolic/diastolic CHF Severe aortic stenosis, mitral stenosis, TR pulm HTN Hypotension > chronic per pt baseline SBP in 70s. (LA, cortisol WNL)  P:  Plan for LHC and RHC today. TCVS consult pending LHC and RHC.  Cards following Telemetry monitoring Will tolerate hypotension for now as mental status intact, consider pressors if worsen. BP has been 70sys x 3 mos.   RENAL A:   ESRD on HD TTS  P:   Cont CVVH. Renal following. (-) 8L since admission    GASTROINTESTINAL A:   No acute issues  P:   Renal diet with fluid restriction Protonix for SUP  HEMATOLOGIC A:   Anemia of chronic illness  P:  Follow Heparin SQ for VTE ppx  INFECTIOUS A:   MRSA bacteremia considered. (+) in 1/2 bottles (this bottle from permcath) from OSH allegedly. We called [facility] on 7/27 and so far, all blood cultures have been (-).   P:   Perm cath was dc'd on 7/26 2/2 suspected line infection. He had L IJ HD on 7/26.  Cont vanc for now until official report from [place] is in > as of 7/27, all blood cultures from [place] were (-). I told  RN to f/u on official results.  Rpt blood culture from 7/26 (-) so far.   ENDOCRINE A:   No acute issues   P:   Follow glucose on BMP  NEUROLOGIC A:   No acute issues  P:   monitor   FAMILY  - Updates: Patient updated bedside 7/28  - Inter-disciplinary family meet or Palliative Care meeting due by:  8/1   J. Alexis Frock, MD 06/03/2016,  9:37 AM [place] [facility] Pager [phone] After 3 pm or if no answer, call [phone]

## 2016-06-03 NOTE — Care Management Note (Signed)
Case Management Note  Patient Details  Name: Reginald Fields MRN: [id number] Date of Birth: August 20, 1961  Subjective/Objective:   Pt admitted with SOB - Outpt HD for 6 years in [place].  On CRRT and sustained hypotention                    Action/Plan:  Pt alert and oriented.  Pt states he was independent at home with mom .   Expected Discharge Date:                  Expected Discharge Plan:  Home/Self Care  In-House Referral:     Discharge planning Services  CM Consult  Post Acute Care Choice:    Choice offered to:     DME Arranged:    DME Agency:     HH Arranged:    HH Agency:     Status of Service:  In process, will continue to follow  If discussed at Long Length of Stay Meetings, dates discussed:    Additional Comments:  Cherylann Parr, RN 06/03/2016, 8:38 AM

## 2016-06-04 ENCOUNTER — Inpatient Hospital Stay (HOSPITAL_COMMUNITY): Payer: Medicare Other

## 2016-06-04 ENCOUNTER — Other Ambulatory Visit (HOSPITAL_COMMUNITY): Payer: Medicare Other

## 2016-06-04 ENCOUNTER — Encounter (HOSPITAL_COMMUNITY): Payer: Self-pay | Admitting: Emergency Medicine

## 2016-06-04 DIAGNOSIS — R570 Cardiogenic shock: Secondary | ICD-10-CM

## 2016-06-04 LAB — RENAL FUNCTION PANEL
ALBUMIN: 2.9 g/dL — AB (ref 3.5–5.0)
ANION GAP: 7 (ref 5–15)
Albumin: 2.8 g/dL — ABNORMAL LOW (ref 3.5–5.0)
Anion gap: 5 (ref 5–15)
BUN: 23 mg/dL — ABNORMAL HIGH (ref 6–20)
BUN: 23 mg/dL — AB (ref 6–20)
CALCIUM: 10.4 mg/dL — AB (ref 8.9–10.3)
CHLORIDE: 100 mmol/L — AB (ref 101–111)
CHLORIDE: 103 mmol/L (ref 101–111)
CO2: 28 mmol/L (ref 22–32)
CO2: 28 mmol/L (ref 22–32)
CREATININE: 2.45 mg/dL — AB (ref 0.61–1.24)
Calcium: 9.8 mg/dL (ref 8.9–10.3)
Creatinine, Ser: 2.56 mg/dL — ABNORMAL HIGH (ref 0.61–1.24)
GFR calc non Af Amer: 27 mL/min — ABNORMAL LOW (ref >60)
GFR, EST AFRICAN AMERICAN: 31 mL/min — AB (ref >60)
GFR, EST AFRICAN AMERICAN: 33 mL/min — AB (ref >60)
GFR, EST NON AFRICAN AMERICAN: 28 mL/min — AB (ref >60)
GLUCOSE: 129 mg/dL — AB (ref 65–99)
Glucose, Bld: 113 mg/dL — ABNORMAL HIGH (ref 65–99)
PHOSPHORUS: 4.1 mg/dL (ref 2.5–4.6)
POTASSIUM: 4.3 mmol/L (ref 3.5–5.1)
Phosphorus: 3.9 mg/dL (ref 2.5–4.6)
Potassium: 4.3 mmol/L (ref 3.5–5.1)
SODIUM: 136 mmol/L (ref 135–145)
Sodium: 135 mmol/L (ref 135–145)

## 2016-06-04 LAB — MAGNESIUM: Magnesium: 2.5 mg/dL — ABNORMAL HIGH (ref 1.7–2.4)

## 2016-06-04 LAB — TROPONIN I
TROPONIN I: 0.07 ng/mL — AB (ref <0.03)
TROPONIN I: 0.08 ng/mL — AB (ref <0.03)

## 2016-06-04 LAB — APTT: APTT: 37 s — AB (ref 24–36)

## 2016-06-04 MED ORDER — GI COCKTAIL ~~LOC~~
30.0000 mL | Freq: Once | ORAL | Status: AC
  Administered 2016-06-04: 30 mL via ORAL
  Filled 2016-06-04: qty 30

## 2016-06-04 NOTE — Progress Notes (Addendum)
Reginald Fields Progress Note   Subjective: wt down , fells "lighter', breathing a little better  Vitals:   06/04/16 1430 06/04/16 1500 06/04/16 1550 06/04/16 1600  BP: (!) 77/52 (!) 68/50 (!) 62/50 (!) 69/54  Pulse: 76     Resp: (!) (!) 22  Temp:      TempSrc:      SpO2: (!) 79%     Weight:      Height:        Inpatient medications: . antiseptic oral rinse  7 mL Mouth Rinse BID  . arformoterol  15 mcg Nebulization BID  . budesonide (PULMICORT) nebulizer solution  0.5 mg Nebulization BID  . Chlorhexidine Gluconate Cloth  6 each Topical Q0600  . darbepoetin (ARANESP) injection - DIALYSIS  200 mcg Intravenous Q Sat-HD  . heparin subcutaneous  5,000 Units Subcutaneous Q8H  . mupirocin ointment  1 application Nasal BID  . pantoprazole  40 mg Oral QHS  . sodium chloride flush  10-40 mL Intracatheter Q12H  . sodium chloride flush  3 mL Intravenous Q12H  . vancomycin  1,250 mg Intravenous Q24H   . dialysis replacement fluid (prismasate) 400 mL/hr at 06/04/16 1200  . dialysis replacement fluid (prismasate) 200 mL/hr at 06/03/16 2312  . dialysate (PRISMASATE) 2,000 mL/hr at 06/04/16 1523   sodium chloride, sodium chloride, acetaminophen, alteplase, heparin, heparin, hydrocortisone, ondansetron (ZOFRAN) IV, sodium chloride flush, sodium chloride flush, traMADol  Exam: Gen : obesity, chron ill-appearing, not in distress Chest clear bilat, no rales or wheezing RRR loud 3/6 SEM throughout the upper chest, no RG Abd obese, nontender +bs Ext 1-+ bilat LE edema Neuro is alert, Ox 3 , nf  CXR 7/26 - bilat diffuse pulm edema, moderate severity CXR 7/27 - slight improvement in CHF CXR 7/28 - pending  HD [place]   TTS  5h  400/1.5   106kg  2/2.5 bath   Heparin 7000 w mid Rx 3000 Getting Mircera 200 ug due on Sat 7/29 Getting loading dose of iron   Assessment: 1  Severe AS - for heart cath today 2  Pulm edema/severe vol overload - decreasing vol w CRRT 3   Hypotension, looks chronic/ subacute at min 4  ESRD TTS hd, on HD 6 yrs. Has temp cath in now for HD. 5  MRSA +blood cx from outside hosp- tunneled cath removed 7/26 6  Hist afib, in NSR here   7  Anemia of CKD, cont max ESA here; tsat 14%, but has iron "allergy"   Plan - cont CRRT, lower UF 100-150/hr and get f/u CXR in am   Reginald Moselle MD [facility] pager [phone]    cell [phone] 06/04/2016, 4:32 PM    Recent Labs Lab 06/03/16 0423 06/03/16 1625 06/04/16 0432  NA 136 135 135  K 4.0 4.5 4.3  CL 101 102 100*  CO2 GLUCOSE 112* 149* 129*  BUN 22* 22* 23*  CREATININE 3.28* 2.94* 2.56*  CALCIUM 9.3 9.5 9.8  PHOS 3.6 4.2 3.9    Recent Labs Lab 05/31/16 1822  06/03/16 0423 06/03/16 1625 06/04/16 0432  AST 24  --   --   --   --   ALT 11*  --   --   --   --   ALKPHOS 95  --   --   --   --   BILITOT 2.0*  --   --   --   --   PROT  6.3*  --   --   --   --   ALBUMIN 2.9*  < > 2.8* 2.8* 2.8*  < > = values in this interval not displayed.  Recent Labs Lab 05/31/16 1822 06/01/16 0521 06/02/16 0419 06/03/16 0542  WBC 8.3 7.8 6.1 7.5  NEUTROABS 6.9  --   --   --   HGB 8.1* 7.9* 7.4* 7.5*  HCT 27.9* 26.8* 24.6* 24.9*  MCV 103.0* 102.7* 101.7* 102.0*  PLT 185 206 179 171   Iron/TIBC/Ferritin/ %Sat    Component Value Date/Time   IRON 40 (L) 06/03/2016 1228   TIBC 293 06/03/2016 1228   IRONPCTSAT 14 (L) 06/03/2016 1228

## 2016-06-04 NOTE — Progress Notes (Signed)
    Subjective:   Remains on CVVHD pulling 400/hr. Weigh down 31 pounds since 7/27. Breathing better. SBP remains 60-70s but asymptomatic.  Cath cancelled yesterday due to cath lab emergency   . antiseptic oral rinse  7 mL Mouth Rinse BID  . arformoterol  15 mcg Nebulization BID  . budesonide (PULMICORT) nebulizer solution  0.5 mg Nebulization BID  . Chlorhexidine Gluconate Cloth  6 each Topical Q0600  . darbepoetin (ARANESP) injection - DIALYSIS  200 mcg Intravenous Q Sat-HD  . heparin subcutaneous  5,000 Units Subcutaneous Q8H  . mupirocin ointment  1 application Nasal BID  . pantoprazole  40 mg Oral QHS  . sodium chloride flush  10-40 mL Intracatheter Q12H  . sodium chloride flush  3 mL Intravenous Q12H  . vancomycin  1,250 mg Intravenous Q24H    Objective:  Vital Signs in the last 24 hours: Temp:  [97.4 F (36.3 C)-98.5 F (36.9 C)] 97.4 F (36.3 C) (07/29 0801) Pulse Rate:  [92-212] 94 (07/29 1200) Resp:  [9-29] 19 (07/29 1200) BP: (64-91)/(45-68) 73/49 (07/29 1200) SpO2:  [96 %-100 %] 100 % (07/29 1200) Weight:  [110.2 kg (242 lb 15.2 oz)] 110.2 kg (242 lb 15.2 oz) (07/29 0600)  Intake/Output from previous day: 07/28 0701 - 07/29 0700 In: 985 [P.O.:985] Out: 9718   Physical Exam: Pt is alert and oriented, obese male sitting up in bed in NAD HEENT: normal Neck: JVP - elevated LIJ trialysis cath Lungs: rales bilaterally CV: RRR with 4/6 harsh systolic murmur best heard at the apex 2/6 TR Abd: soft, NT, Positive BS, no hepatomegaly Ext: diffuse 2+ edema Skin: warm/dry no rash   Lab Results:  Recent Labs  06/02/16 0419 06/03/16 0542  WBC 6.1 7.5  HGB 7.4* 7.5*  PLT 179 171    Recent Labs  06/03/16 1625 06/04/16 0432  NA 135 135  K 4.5 4.3  CL 102 100*  CO2 26 28  GLUCOSE 149* 129*  BUN 22* 23*  CREATININE 2.94* 2.56*    Recent Labs  06/04/16 0138 06/04/16 0707  TROPONINI 0.07* 0.08*    Cardiac Studies: Echo reviewed  Tele: Sinus  rhythm  Assessment/Plan:  1. Hypotension/Cardiogenic shock, acute on chronic 2. Acute on chronic diastolic heart failure, NYHA functional class IV    --EF 65-70% on echo 7/26 3. Severe aortic stenosis 4. End-stage renal disease 5. Anemia, probable acute on chronic with hemoglobin documented at 6.9 on July 23, 7.5 mg/dL today 6. Elevated troponin likely demand ischemia 7. Recent upper extremity DVT  8. Severe mitral stenosis 9. Severe tricuspid regurgitation 10. Severe pulmonary hypertension  Echo images reviewed personally.   Volume status much improved with CVVHD but remains volume overload. Would continue to pull but be mindful that he is preload dependent with severe AS. SBP currently 60-70. Would have low threshold to support with levophed. Will defer to Renal as they know him well and have been managing BP SBP 60-70 apparently chronic for him)   Plan R/L cath on Monday   Agree with Dr. Excell Seltzer. Cardiac surgical consultation pending post-cath once all data is complete. He may require triple valve surgery and he will be at extremely high-risk with his severe comorbid conditions and debility.  Arvilla Meres, M.D. 06/04/2016, 12:07 PM

## 2016-06-04 NOTE — Progress Notes (Signed)
PULMONARY / CRITICAL CARE MEDICINE   Name: Reginald Fields MRN: [id number] DOB: 1961/06/21    ADMISSION DATE:  05/31/2016 CONSULTATION DATE:  05/31/2016  REFERRING MD:  [facility] MD  CHIEF COMPLAINT:  SOB  HISTORY OF PRESENT ILLNESS:   55 year old male with PMH as below, which includes known severe AS, COPD (on chronic 3L O2), atrial fibrillation, ESRD (on HD TTS), HTN, PAH, and bacteremia. He was [facility] with complaints of SOB and bilateral edema which have been progressive for 3 months. He has been more hypotensive recently which has limited HD to an extent. Also beat blockers have been stopped. He underwent HD 4/24 with removal of 1L. He also required 1 unit PRBC transfusion. HD limited by hypotension. 7/25 he was still having SOB and pain in the legs and remained hypotensive. He was transferred to ICU. SOB thought to be from systolic CHF secondary to severe AS. Patient is followed by CVTS in [place], so he has been transferred to [facility] for further evaluation.    SUBJECTIVE:   Remains on CVVHD, net neg goal 400 ml/hr, planned TAVR for 7/31.  No pressors.  Chest discomfort last night.  Troponin 0.08.  NPO in the event of emergent LHC.  Pt reports chest and abdominal discomfort have resolved.  Net negative 16.5 L  VITAL SIGNS: BP (!) 84/63 (BP Location: Left Arm)   Pulse 96   Temp 97.4 F (36.3 C) (Oral)   Resp (!) 23   Ht 5\' 9"  (1.753 m)   Wt 242 lb 15.2 oz (110.2 kg)   SpO2 100%   BMI 35.88 kg/m   HEMODYNAMICS:    VENTILATOR SETTINGS:    INTAKE / OUTPUT: I/O last 3 completed shifts: In: 985 [P.O.:985] Out: 45859 [Other:14361]  PHYSICAL EXAMINATION: General:  Obese male in NAD. Comfortable. Tolerating CVVH Neuro:  Alert, oriented, not in distress.   HEENT:  [place]/AT, PERRL, no appreciable  Cardiovascular:  RRR, 4/6 systolic murmur Lungs:  Distant, no wheeze. Effort normal. Some crackles at bases. L HD catheter in neck seen > looks  clean Abdomen:  Soft, non-tender, non-distended Musculoskeletal:  No acute deformity or ROM limitation. Trace generalized edema   Skin:  Grossly intact  LABS:  BMET  Recent Labs Lab 06/03/16 0423 06/03/16 1625 06/04/16 0432  NA 136 135 135  K 4.0 4.5 4.3  CL 101 102 100*  CO2 27 26 28   BUN 22* 22* 23*  CREATININE 3.28* 2.94* 2.56*  GLUCOSE 112* 149* 129*    Electrolytes  Recent Labs Lab 06/02/16 0419  06/03/16 0422 06/03/16 0423 06/03/16 1625 06/04/16 0138 06/04/16 0432  CALCIUM 8.2*  < >  --  9.3 9.5  --  9.8  MG 2.1  --  2.3  --   --  2.5*  --   PHOS 4.2  < >  --  3.6 4.2  --  3.9  < > = values in this interval not displayed.  CBC  Recent Labs Lab 06/01/16 0521 06/02/16 0419 06/03/16 0542  WBC 7.8 6.1 7.5  HGB 7.9* 7.4* 7.5*  HCT 26.8* 24.6* 24.9*  PLT 206 179 171    Coag's  Recent Labs Lab 06/02/16 0419 06/02/16 1508 06/03/16 0422 06/03/16 0517 06/04/16 0433  APTT 37*  --  37*  --  37*  INR  --  1.47  --  1.47  --     Sepsis Markers  Recent Labs Lab 05/31/16 1822  LATICACIDVEN 1.8  PROCALCITON 0.91    ABG No results for input(s): PHART, PCO2ART, PO2ART in the last 168 hours.  Liver Enzymes  Recent Labs Lab 05/31/16 1822  06/03/16 0423 06/03/16 1625 06/04/16 0432  AST 24  --   --   --   --   ALT 11*  --   --   --   --   ALKPHOS 95  --   --   --   --   BILITOT 2.0*  --   --   --   --   ALBUMIN 2.9*  < > 2.8* 2.8* 2.8*  < > = values in this interval not displayed.  Cardiac Enzymes  Recent Labs Lab 06/03/16 1958 06/04/16 0138 06/04/16 0707  TROPONINI 0.07* 0.07* 0.08*    Glucose No results for input(s): GLUCAP in the last 168 hours.  Imaging Dg Chest Port 1 View  Result Date: 06/03/2016 CLINICAL DATA:  Pulmonary edema. EXAM: PORTABLE CHEST 1 VIEW COMPARISON:  06/02/2016. FINDINGS: Left IJ line in stable position. Cardiomegaly with bilateral pulmonary infiltrates consistent with pulmonary edema. Bilateral pleural  effusions are noted. No interim change from prior exam. No pneumothorax. IMPRESSION: 1. Left IJ line stable position. 2. Stable cardiomegaly with bilateral pulmonary edema and bilateral pleural effusions. No interim change from prior exam . Electronically Signed   By: Maisie Fus  Register   On: 06/03/2016 17:28    STUDIES:  Echo 7/7: LVEF 50-55%, PA pressures 72-16mmHg, severe AS, moderate MS, mod/sev TR.   CULTURES: Blood culture from perm-cath from OSH [facility] ) > (-) as of 7/27 Blood 7/25 > negative MRSA 7/25 (+)  ANTIBIOTICS: Vanc 7/26 >   SIGNIFICANT EVENTS: 7/25  Transfer from [place] for CVVH. Pt with severe valvular heart dse with volume overload and CKD 5.  7/29  CVVHD continues, net neg 16.5L.  Episode chest pain overnight, resolved.  LINES/TUBES: RIJ perm cath >> dc'd on 7/26 L IJ HD 7/26   ASSESSMENT / PLAN:  PULMONARY A: Acute on chronic hypoxemic respiratory failure secondary to pulmonary edema/volume overload in a setting of severe AS, MS, pulm HTN COPD without acute exacerbation  P:   On going CVVH for volume removal > tolerating with BP 70 systolic (chronic).   Supplemental O2 for sats > 92% Scheduled and PRN nebs Pulmonary hygiene Incentive spirometry   CARDIOVASCULAR A:  Acute on chronic systolic/diastolic CHF Severe aortic stenosis, mitral stenosis, TR pulm HTN Hypotension > chronic per pt baseline SBP in 70s. (LA, cortisol WNL)  P:  Planned TAVR 7/31 Cards following Telemetry monitoring Will tolerate hypotension for now as mental status intact, consider pressors if mental status changes. BP has been 70 sys x 3 mos.   RENAL A:   ESRD on HD TTS  P:   Cont CVVH. Renal following.  Continue negative balance as tolerated    GASTROINTESTINAL A:   No acute issues  P:   Renal diet with fluid restriction Protonix for SUP  HEMATOLOGIC A:   Anemia of chronic illness  P:  Follow Heparin SQ for VTE ppx  INFECTIOUS A:   MRSA  bacteremia considered. (+) in 1/2 bottles (this bottle from permcath) from OSH allegedly. We called [facility] on 7/27 and so far, all blood cultures have been (-).   P:   Perm cath was dc'd on 7/26 2/2 suspected line infection.  Cont vanc for now until official report from [place] is in > as of 7/27, all blood cultures from [place] were negative.  Rpt blood culture from 7/26 (-) so far.   ENDOCRINE A:   No acute issues   P:   Follow glucose on BMP  NEUROLOGIC A:   No acute issues  P:   monitor   FAMILY  - Updates: Patient updated bedside 7/29  - Inter-disciplinary family meet or Palliative Care meeting due by:  8/1    Canary Brim, NP-C [place] [facility] Pgr: [phone] or if no answer [phone] 06/04/2016, 8:35 AM         STAFF NOTE: I, Rory Percy, MD FACP have personally reviewed patient's available data, including medical history, events of note, physical examination and test results as part of my evaluation. I have discussed with resident/NP and other care providers such as pharmacist, RN and RRT. In addition, I personally evaluated patient and elicited key findings of: resolved CP, remains on high volume off, neg 16 liters, no distress, for procedure in am , not impressed for infection bacteremia, line has been removed, I have  Concerns for the volume rate off in setting of severe as, may run into sudden preload reduction and shock / code, vanc to continue, will ensure coags pre op, diet, IS, trop noted, runs low  And perfuses well , lactic acid was also reassuring, pct may help to limit ABX The patient is critically ill with multiple organ systems failure and requires high complexity decision making for assessment and support, frequent evaluation and titration of therapies, application of advanced monitoring technologies and extensive interpretation of multiple databases.   Critical Care Time devoted to patient care services described in  this note is 30 Minutes. This time reflects time of care of this signee: Rory Percy, MD FACP. This critical care time does not reflect procedure time, or teaching time or supervisory time of PA/NP/Med student/Med Resident etc but could involve care discussion time. Rest per NP/medical resident whose note is outlined above and that I agree with   Mcarthur Rossetti. Tyson Alias, MD, FACP Pgr: [phone] [place] [facility] 06/04/2016 11:25 AM

## 2016-06-05 DIAGNOSIS — J9600 Acute respiratory failure, unspecified whether with hypoxia or hypercapnia: Secondary | ICD-10-CM | POA: Diagnosis present

## 2016-06-05 LAB — RENAL FUNCTION PANEL
ALBUMIN: 2.8 g/dL — AB (ref 3.5–5.0)
Albumin: 2.8 g/dL — ABNORMAL LOW (ref 3.5–5.0)
Anion gap: 6 (ref 5–15)
Anion gap: 5 (ref 5–15)
BUN: 21 mg/dL — AB (ref 6–20)
BUN: 20 mg/dL (ref 6–20)
CO2: 27 mmol/L (ref 22–32)
CO2: 28 mmol/L (ref 22–32)
CREATININE: 2.27 mg/dL — AB (ref 0.61–1.24)
CREATININE: 2.24 mg/dL — AB (ref 0.61–1.24)
Calcium: 10.3 mg/dL (ref 8.9–10.3)
Calcium: 9.9 mg/dL (ref 8.9–10.3)
Chloride: 103 mmol/L (ref 101–111)
Chloride: 103 mmol/L (ref 101–111)
GFR calc Af Amer: 36 mL/min — ABNORMAL LOW (ref >60)
GFR calc Af Amer: 37 mL/min — ABNORMAL LOW (ref >60)
GFR calc non Af Amer: 32 mL/min — ABNORMAL LOW (ref >60)
GFR, EST NON AFRICAN AMERICAN: 31 mL/min — AB (ref >60)
GLUCOSE: 96 mg/dL (ref 65–99)
Glucose, Bld: 97 mg/dL (ref 65–99)
PHOSPHORUS: 3.6 mg/dL (ref 2.5–4.6)
PHOSPHORUS: 3.5 mg/dL (ref 2.5–4.6)
Potassium: 4.2 mmol/L (ref 3.5–5.1)
Potassium: 4.5 mmol/L (ref 3.5–5.1)
SODIUM: 136 mmol/L (ref 135–145)
Sodium: 136 mmol/L (ref 135–145)

## 2016-06-05 LAB — CBC
HCT: 26.8 % — ABNORMAL LOW (ref 39.0–52.0)
Hemoglobin: 7.4 g/dL — ABNORMAL LOW (ref 13.0–17.0)
MCH: 29.5 pg (ref 26.0–34.0)
MCHC: 27.6 g/dL — AB (ref 30.0–36.0)
MCV: 106.8 fL — ABNORMAL HIGH (ref 78.0–100.0)
PLATELETS: 185 10*3/uL (ref 150–400)
RBC: 2.51 MIL/uL — ABNORMAL LOW (ref 4.22–5.81)
RDW: 19.1 % — ABNORMAL HIGH (ref 11.5–15.5)
WBC: 8.6 10*3/uL (ref 4.0–10.5)

## 2016-06-05 LAB — APTT
aPTT: 35 seconds (ref 24–36)
aPTT: 36 seconds (ref 24–36)

## 2016-06-05 LAB — CULTURE, BLOOD (ROUTINE X 2)
CULTURE: NO GROWTH
Culture: NO GROWTH

## 2016-06-05 LAB — PROTIME-INR
INR: 1.43
Prothrombin Time: 17.6 seconds — ABNORMAL HIGH (ref 11.4–15.2)

## 2016-06-05 LAB — MAGNESIUM: MAGNESIUM: 2.4 mg/dL (ref 1.7–2.4)

## 2016-06-05 NOTE — Progress Notes (Signed)
[facility] Progress Note   Subjective: no new c/o  Vitals:   06/05/16 1330 06/05/16 1400 06/05/16 1430 06/05/16 1500  BP: (!) 67/56 (!) 57/40 (!) 64/50 (!) 67/50  Pulse:      Resp: 10 17 14 19   Temp:      TempSrc:      SpO2:      Weight:      Height:        Inpatient medications: . antiseptic oral rinse  7 mL Mouth Rinse BID  . arformoterol  15 mcg Nebulization BID  . budesonide (PULMICORT) nebulizer solution  0.5 mg Nebulization BID  . darbepoetin (ARANESP) injection - DIALYSIS  200 mcg Intravenous Q Sat-HD  . heparin subcutaneous  5,000 Units Subcutaneous Q8H  . mupirocin ointment  1 application Nasal BID  . pantoprazole  40 mg Oral QHS  . sodium chloride flush  10-40 mL Intracatheter Q12H  . sodium chloride flush  3 mL Intravenous Q12H  . vancomycin  1,250 mg Intravenous Q24H   . dialysis replacement fluid (prismasate) 400 mL/hr at 06/05/16 1400  . dialysis replacement fluid (prismasate) 200 mL/hr at 06/05/16 0041  . dialysate (PRISMASATE) 2,000 mL/hr at 06/05/16 1400   sodium chloride, sodium chloride, acetaminophen, alteplase, heparin, heparin, hydrocortisone, ondansetron (ZOFRAN) IV, sodium chloride flush, sodium chloride flush, traMADol  Exam: Gen : obesity, chron ill-appearing, not in distress Chest clear bilat, no rales or wheezing RRR loud 3/6 SEM throughout the upper chest, no RG Abd obese, nontender +bs Ext 1-+ bilat LE edema Neuro is alert, Ox 3 , nf  CXR 7/26 - bilat diffuse pulm edema, moderate severity CXR 7/27 - slight improvement in CHF CXR 7/28 - pending  HD [place]   TTS  5h  400/1.5   106kg  2/2.5 bath   Heparin 7000 w mid Rx 3000 Getting Mircera 200 ug due on Sat 7/29 Getting loading dose of iron   Assessment: 1  Severe AS - for heart cath today 2  Pulm edema/severe vol overload - decreasing vol w CRRT 3  Hypotension, looks chronic/ subacute at min 4  ESRD TTS hd, on HD 6 yrs. Has temp cath in now for HD. 5  MRSA  +blood cx from outside hosp- tunneled cath removed 7/26 6  Hist afib, in NSR here   7  Anemia of CKD, cont max ESA here; tsat 14%, but has iron "allergy", will consider alternative IV iron   Plan - cont CRRT, lower UF 100-150/hr, get f/u CXR in am   [name] MD [facility] pager [phone]    cell [phone] 06/05/2016, 3:39 PM    Recent Labs Lab 06/04/16 0432 06/04/16 1700 06/05/16 0418  NA 135 136 136  K 4.3 4.3 4.2  CL 100* 103 103  CO2 28 28 27   GLUCOSE 129* 113* 97  BUN 23* 23* 21*  CREATININE 2.56* 2.45* 2.27*  CALCIUM 9.8 10.4* 10.3  PHOS 3.9 4.1 3.6    Recent Labs Lab 05/31/16 1822  06/04/16 0432 06/04/16 1700 06/05/16 0418  AST 24  --   --   --   --   ALT 11*  --   --   --   --   ALKPHOS 95  --   --   --   --   BILITOT 2.0*  --   --   --   --   PROT 6.3*  --   --   --   --   ALBUMIN  2.9*  < > 2.8* 2.9* 2.8*  < > = values in this interval not displayed.  Recent Labs Lab 05/31/16 1822  06/02/16 0419 06/03/16 0542 06/05/16 0418  WBC 8.3  < > 6.1 7.5 8.6  NEUTROABS 6.9  --   --   --   --   HGB 8.1*  < > 7.4* 7.5* 7.4*  HCT 27.9*  < > 24.6* 24.9* 26.8*  MCV 103.0*  < > 101.7* 102.0* 106.8*  PLT 185  < > 179 171 185  < > = values in this interval not displayed. Iron/TIBC/Ferritin/ %Sat    Component Value Date/Time   IRON 40 (L) 06/03/2016 1228   TIBC 293 06/03/2016 1228   IRONPCTSAT 14 (L) 06/03/2016 1228

## 2016-06-05 NOTE — Progress Notes (Signed)
    Subjective:   Remains on CVVHD. Now pulling 100-150 (was 400/hr yesterday). Weight down another 9 pounds. (40 pounds total)  Breathing better. Feels cold. SBP remains 60-70s but asymptomatic.   Marland Kitchen antiseptic oral rinse  7 mL Mouth Rinse BID  . arformoterol  15 mcg Nebulization BID  . budesonide (PULMICORT) nebulizer solution  0.5 mg Nebulization BID  . darbepoetin (ARANESP) injection - DIALYSIS  200 mcg Intravenous Q Sat-HD  . heparin subcutaneous  5,000 Units Subcutaneous Q8H  . mupirocin ointment  1 application Nasal BID  . pantoprazole  40 mg Oral QHS  . sodium chloride flush  10-40 mL Intracatheter Q12H  . sodium chloride flush  3 mL Intravenous Q12H  . vancomycin  1,250 mg Intravenous Q24H    Objective:  Vital Signs in the last 24 hours: Temp:  [97.1 F (36.2 C)-98 F (36.7 C)] 97.8 F (36.6 C) (07/30 0800) Pulse Rate:  [72-95] 85 (07/30 0800) Resp:  [10-31] 16 (07/30 1100) BP: (55-90)/(36-64) 69/50 (07/30 1100) SpO2:  [96 %-100 %] 99 % (07/30 0817) Weight:  [105.8 kg (233 lb 4 oz)] 105.8 kg (233 lb 4 oz) (07/30 0400)  Intake/Output from previous day: 07/29 0701 - 07/30 0700 In: 800 [P.O.:540; I.V.:10; IV Piggyback:250] Out: [id number]   Physical Exam: Pt is alert and oriented, obese male sitting up in bed in NAD.  HEENT: normal Neck: JVP - elevated LIJ trialysis cath Lungs: clear CV: RRR with 4/6 harsh systolic murmur best heard at the apex 2/6 TR Abd: soft, NT, Positive BS, no hepatomegaly Ext: warm. 1+ edema Skin: warm/dry no rash   Lab Results:  Recent Labs  06/03/16 0542 06/05/16 0418  WBC 7.5 8.6  HGB 7.5* 7.4*  PLT 171 185    Recent Labs  06/04/16 1700 06/05/16 0418  NA 136 136  K 4.3 4.2  CL 103 103  CO2 28 27  GLUCOSE 113* 97  BUN 23* 21*  CREATININE 2.45* 2.27*    Recent Labs  06/04/16 0138 06/04/16 0707  TROPONINI 0.07* 0.08*    Cardiac Studies: Echo reviewed personally on 7/28  Tele: Sinus rhythm  Assessment/Plan:    1. Hypotension/Cardiogenic shock, acute on chronic 2. Acute on chronic diastolic heart failure, NYHA functional class IV    --EF 65-70% on echo 7/26 3. Severe aortic stenosis 4. End-stage renal disease 5. Anemia, probable acute on chronic with hemoglobin documented at 6.9 on July 23, 7.5 mg/dL today 6. Elevated troponin likely demand ischemia 7. Recent upper extremity DVT  8. Severe mitral stenosis 9. Severe tricuspid regurgitation 10. Severe pulmonary hypertension 11. MRSA bacteremia on bcx from OSH - Vascath pulled  Volume status much improved with CVVHD but remains volume overload (CVP 18). Would continue to pull but be mindful that he is preload dependent with severe AS. SBP currently 60-70. Would have low threshold to support with levophed. Will defer to Renal as they know him well and have been managing BP SBP 60-70 apparently chronic for him)   Plan R/L cath tomorrow. He is on cath board and orders written.   Agree with Dr. Excell Seltzer. Cardiac surgical consultation pending post-cath once all data is complete. He may require triple valve surgery and he will be at extremely high-risk with his severe comorbid conditions and debility.  Arvilla Meres, M.D. 06/05/2016, 11:26 AM

## 2016-06-05 NOTE — Progress Notes (Signed)
PULMONARY / CRITICAL CARE MEDICINE   Name: Reginald Fields MRN: [id number] DOB: 10-26-1961    ADMISSION DATE:  05/31/2016 CONSULTATION DATE:  05/31/2016  REFERRING MD:  [facility] MD  CHIEF COMPLAINT:  SOB  HISTORY OF PRESENT ILLNESS:   55 year old male with PMH as below, which includes known severe AS, COPD (on chronic 3L O2), atrial fibrillation, ESRD (on HD TTS), HTN, PAH, and bacteremia. He was admitted to [facility] with complaints of SOB and bilateral edema which have been progressive for 3 months. He has been more hypotensive recently which has limited HD to an extent. Also beat blockers have been stopped. He underwent HD 4/24 with removal of 1L. He also required 1 unit PRBC transfusion. HD limited by hypotension. 7/25 he was still having SOB and pain in the legs and remained hypotensive. He was transferred to ICU. SOB thought to be from systolic CHF secondary to severe AS. Patient is followed by CVTS in [place], so he has been [facility] for further evaluation.    SUBJECTIVE:   Remains on CVVHD, net neg goal 100-150 ml/hr, net netative 21L this admit, planned TAVR for 7/31.  No pressors.  No further chest / abd discomfort. Tolerating PO's   VITAL SIGNS: BP (!) 66/56   Pulse 85   Temp 97.8 F (36.6 C) (Oral)   Resp 15   Ht  (1.753 m)   Wt 233 lb 4 oz (105.8 kg)   SpO2 99%   BMI 34.44 kg/m   HEMODYNAMICS: CVP:  [18 mmHg-20 mmHg] 18 mmHg  VENTILATOR SETTINGS:    INTAKE / OUTPUT: I/O last 3 completed shifts: In: 1425 [P.O.:1165; I.V.:10; IV Piggyback:250] Out: 40981 [Other:11147]  PHYSICAL EXAMINATION: General:  Obese male in NAD. Comfortable. Tolerating CVVH Neuro:  Alert, oriented, not in distress.   HEENT:  Poca/AT, PERRL, no appreciable JVD, L IJ HD cath clean Cardiovascular:  RRR, 4/6 systolic murmur Lungs:  Distant, no wheeze, clear anterior. Normal effort.   Abdomen:  Soft, non-tender, non-distended Musculoskeletal:  No  acute deformity or ROM limitation. Trace generalized edema   Skin:  Grossly intact  LABS:  BMET  Recent Labs Lab 06/04/16 0432 06/04/16 1700 06/05/16 0418  NA 135 136 136  K 4.3 4.3 4.2  CL 100* 103 103  CO2 BUN 23* 23* 21*  CREATININE 2.56* 2.45* 2.27*  GLUCOSE 129* 113* 97    Electrolytes  Recent Labs Lab 06/03/16 0422  06/04/16 0138 06/04/16 0432 06/04/16 1700 06/05/16 0418  CALCIUM  --   < >  --  9.8 10.4* 10.3  MG 2.3  --  2.5*  --   --  2.4  PHOS  --   < >  --  3.9 4.1 3.6  < > = values in this interval not displayed.  CBC  Recent Labs Lab 06/02/16 0419 06/03/16 0542 06/05/16 0418  WBC 6.1 7.5 8.6  HGB 7.4* 7.5* 7.4*  HCT 24.6* 24.9* 26.8*  PLT 179 171 185    Coag's  Recent Labs Lab 06/02/16 1508 06/03/16 0422 06/03/16 0517 06/04/16 0433 06/05/16 0418  APTT  --  37*  --  37* 35  INR 1.47  --  1.47  --   --     Sepsis Markers  Recent Labs Lab 05/31/16 1822  LATICACIDVEN 1.8  PROCALCITON 0.91    ABG No results for input(s): PHART, PCO2ART, PO2ART in the last 168 hours.  Liver Enzymes  Recent  Labs Lab 05/31/16 1822  06/04/16 0432 06/04/16 1700 06/05/16 0418  AST 24  --   --   --   --   ALT 11*  --   --   --   --   ALKPHOS 95  --   --   --   --   BILITOT 2.0*  --   --   --   --   ALBUMIN 2.9*  < > 2.8* 2.9* 2.8*  < > = values in this interval not displayed.  Cardiac Enzymes  Recent Labs Lab 06/03/16 1958 06/04/16 0138 06/04/16 0707  TROPONINI 0.07* 0.07* 0.08*    Glucose No results for input(s): GLUCAP in the last 168 hours.  Imaging Dg Chest Port 1 View  Result Date: 06/04/2016 CLINICAL DATA:  Pulmonary edema. EXAM: PORTABLE CHEST 1 VIEW COMPARISON:  Chest x-rays dated 06/03/2016 and 06/02/2016. FINDINGS: Cardiomegaly is stable. Overall cardiomediastinal silhouette is stable in size and configuration. Left IJ catheter stable in position with tip at the upper margin of the SVC. Bilateral pulmonary  edema is stable. Small bilateral pleural effusions are stable. No new lung findings. IMPRESSION: Stable exam. Pulmonary edema is unchanged. Small bilateral pleural effusions again noted. Cardiomegaly is stable. Electronically Signed   By: Bary Richard M.D.   On: 06/04/2016 17:45    STUDIES:  Echo 7/7: LVEF 50-55%, PA pressures 72-46mmHg, severe AS, moderate MS, mod/sev TR.   CULTURES: Blood culture from perm-cath from OSH [facility] ) > (-) as of 7/27 Blood 7/25 > negative MRSA 7/25 (+)  ANTIBIOTICS: Vanc 7/26 >   SIGNIFICANT EVENTS: 7/25  Transfer from [place] for CVVH. Pt with severe valvular heart dse with volume overload and CKD 5.  7/29  CVVHD continues, net neg 16.5L.  Episode chest pain overnight, resolved.  LINES/TUBES: RIJ perm cath >> dc'd on 7/26 L IJ HD 7/26   ASSESSMENT / PLAN:  PULMONARY A: Acute on chronic hypoxemic respiratory failure secondary to pulmonary edema/volume overload in a setting of severe AS, MS, pulm HTN COPD without acute exacerbation  P:   On going CVVH for volume removal > tolerating with BP 50-70 systolic (chronic).   Supplemental O2 for sats > 92% Scheduled and PRN nebs Pulmonary hygiene Incentive spirometry   CARDIOVASCULAR A:  Acute on chronic systolic/diastolic CHF Severe aortic stenosis, mitral stenosis, TR pulm HTN Hypotension > chronic per pt baseline SBP in 70s. (LA, cortisol WNL)  P:  Planned TAVR 7/31 Cards following Telemetry monitoring Will tolerate hypotension for now as mental status intact, consider pressors if mental status changes. BP has been 70 sys x 3 mos.   RENAL A:   ESRD on HD TTS  P:   Cont CVVH. Renal following.  Continue negative balance as tolerated, monitor closely with severe AS    GASTROINTESTINAL A:   No acute issues  P:   Renal diet with fluid restriction Protonix for SUP  HEMATOLOGIC A:   Anemia of chronic illness  P:  Follow Heparin SQ for VTE ppx  INFECTIOUS A:    MRSA bacteremia considered. (+) in 1/2 bottles (this bottle from permcath) from OSH allegedly. [facility] hospital on 7/27 and so far, all blood cultures have been (-).   P:   Perm cath was dc'd on 7/26 2/2 suspected line infection.  Cont vanc for now until official report from [place] is in > as of 7/27, all blood cultures from [place] were negative.  Rpt blood culture from 7/26 (-) so far.  ENDOCRINE A:   No acute issues   P:   Follow glucose on BMP  NEUROLOGIC A:   No acute issues  P:   Monitor / supportive care   FAMILY  - Updates: Patient updated bedside 7/30  - Inter-disciplinary family meet or Palliative Care meeting due by:  8/1    Canary Brim, NP-C [facility] Pgr: [phone] or if no answer [phone] 06/05/2016, 10:04 AM       STAFF NOTE: I, Rory Percy, MD FACP have personally reviewed patient's available data, including medical history, events of note, physical examination and test results as part of my evaluation. I have discussed with resident/NP and other care providers such as pharmacist, RN and RRT. In addition, I personally evaluated patient and elicited key findings of: remains on cvvhd, 21 liter soff, lowering volume rate removal in setting as, BP lower end but mentation continued to be wnl, pre op for am procedure, npo, will re assess coags, if Bp drops further or mentation down will have to dc removal, if needed would use neo first  Mcarthur Rossetti. Tyson Alias, MD, FACP Pgr: [phone] [facility] 06/05/2016 12:52 PM

## 2016-06-06 ENCOUNTER — Encounter (HOSPITAL_COMMUNITY): Payer: Self-pay | Admitting: Thoracic Surgery (Cardiothoracic Vascular Surgery)

## 2016-06-06 ENCOUNTER — Inpatient Hospital Stay (HOSPITAL_COMMUNITY): Payer: Medicare Other

## 2016-06-06 ENCOUNTER — Encounter (HOSPITAL_COMMUNITY)
Admission: AD | Disposition: A | Discharge status: Hospice | Payer: Self-pay | Source: Other Acute Inpatient Hospital | Attending: Internal Medicine

## 2016-06-06 DIAGNOSIS — I071 Rheumatic tricuspid insufficiency: Secondary | ICD-10-CM | POA: Diagnosis present

## 2016-06-06 DIAGNOSIS — J9601 Acute respiratory failure with hypoxia: Secondary | ICD-10-CM

## 2016-06-06 DIAGNOSIS — I50810 Right heart failure, unspecified: Secondary | ICD-10-CM

## 2016-06-06 DIAGNOSIS — I5043 Acute on chronic combined systolic (congestive) and diastolic (congestive) heart failure: Secondary | ICD-10-CM | POA: Diagnosis present

## 2016-06-06 DIAGNOSIS — I342 Nonrheumatic mitral (valve) stenosis: Secondary | ICD-10-CM

## 2016-06-06 DIAGNOSIS — I361 Nonrheumatic tricuspid (valve) insufficiency: Secondary | ICD-10-CM

## 2016-06-06 DIAGNOSIS — I350 Nonrheumatic aortic (valve) stenosis: Secondary | ICD-10-CM

## 2016-06-06 HISTORY — PX: CARDIAC CATHETERIZATION: SHX172

## 2016-06-06 LAB — CBC
HCT: 25.7 % — ABNORMAL LOW (ref 39.0–52.0)
HEMOGLOBIN: 7.4 g/dL — AB (ref 13.0–17.0)
MCH: 30.1 pg (ref 26.0–34.0)
MCHC: 28.8 g/dL — AB (ref 30.0–36.0)
MCV: 104.5 fL — ABNORMAL HIGH (ref 78.0–100.0)
Platelets: 180 10*3/uL (ref 150–400)
RBC: 2.46 MIL/uL — ABNORMAL LOW (ref 4.22–5.81)
RDW: 18.7 % — ABNORMAL HIGH (ref 11.5–15.5)
WBC: 6.8 10*3/uL (ref 4.0–10.5)

## 2016-06-06 LAB — RENAL FUNCTION PANEL
ALBUMIN: 2.7 g/dL — AB (ref 3.5–5.0)
ANION GAP: 6 (ref 5–15)
Albumin: 2.7 g/dL — ABNORMAL LOW (ref 3.5–5.0)
Anion gap: 4 — ABNORMAL LOW (ref 5–15)
BUN: 20 mg/dL (ref 6–20)
BUN: 19 mg/dL (ref 6–20)
CALCIUM: 9.8 mg/dL (ref 8.9–10.3)
CHLORIDE: 105 mmol/L (ref 101–111)
CHLORIDE: 105 mmol/L (ref 101–111)
CO2: 26 mmol/L (ref 22–32)
CO2: 28 mmol/L (ref 22–32)
CREATININE: 2.17 mg/dL — AB (ref 0.61–1.24)
Calcium: 9.5 mg/dL (ref 8.9–10.3)
Creatinine, Ser: 2.06 mg/dL — ABNORMAL HIGH (ref 0.61–1.24)
GFR calc Af Amer: 38 mL/min — ABNORMAL LOW (ref >60)
GFR, EST AFRICAN AMERICAN: 41 mL/min — AB (ref >60)
GFR, EST NON AFRICAN AMERICAN: 35 mL/min — AB (ref >60)
GFR, EST NON AFRICAN AMERICAN: 33 mL/min — AB (ref >60)
Glucose, Bld: 81 mg/dL (ref 65–99)
Glucose, Bld: 81 mg/dL (ref 65–99)
PHOSPHORUS: 3.3 mg/dL (ref 2.5–4.6)
POTASSIUM: 4.7 mmol/L (ref 3.5–5.1)
Phosphorus: 3.5 mg/dL (ref 2.5–4.6)
Potassium: 5.1 mmol/L (ref 3.5–5.1)
SODIUM: 137 mmol/L (ref 135–145)
Sodium: 137 mmol/L (ref 135–145)

## 2016-06-06 LAB — POCT I-STAT 3, VENOUS BLOOD GAS (G3P V)
Acid-Base Excess: 1 mmol/L (ref 0.0–2.0)
BICARBONATE: 26.2 meq/L — AB (ref 20.0–24.0)
Bicarbonate: 27.1 mEq/L — ABNORMAL HIGH (ref 20.0–24.0)
O2 SAT: 32 %
O2 Saturation: 38 %
PCO2 VEN: 49.7 mmHg (ref 45.0–50.0)
PH VEN: 7.344 — AB (ref 7.250–7.300)
TCO2: 28 mmol/L (ref 0–100)
TCO2: 29 mmol/L (ref 0–100)
pCO2, Ven: 48.1 mmHg (ref 45.0–50.0)
pH, Ven: 7.345 — ABNORMAL HIGH (ref 7.250–7.300)
pO2, Ven: 22 mmHg — ABNORMAL LOW (ref 31.0–45.0)
pO2, Ven: 24 mmHg — ABNORMAL LOW (ref 31.0–45.0)

## 2016-06-06 LAB — POCT I-STAT 3, ART BLOOD GAS (G3+)
BICARBONATE: 25.1 meq/L — AB (ref 20.0–24.0)
O2 Saturation: 91 %
TCO2: 26 mmol/L (ref 0–100)
pCO2 arterial: 43.9 mmHg (ref 35.0–45.0)
pH, Arterial: 7.366 (ref 7.350–7.450)
pO2, Arterial: 64 mmHg — ABNORMAL LOW (ref 80.0–100.0)

## 2016-06-06 LAB — APTT: APTT: 39 s — AB (ref 24–36)

## 2016-06-06 LAB — VANCOMYCIN, TROUGH: VANCOMYCIN TR: 25 ug/mL — AB (ref 15–20)

## 2016-06-06 LAB — VANCOMYCIN, RANDOM: VANCOMYCIN RM: 27

## 2016-06-06 LAB — MAGNESIUM: MAGNESIUM: 2.3 mg/dL (ref 1.7–2.4)

## 2016-06-06 SURGERY — RIGHT/LEFT HEART CATH AND CORONARY ANGIOGRAPHY

## 2016-06-06 MED ORDER — SODIUM CHLORIDE 0.9% FLUSH: 3.0000 mL | INTRAVENOUS | Status: DC | PRN

## 2016-06-06 MED ORDER — SODIUM CHLORIDE 0.9 % IV SOLN
INTRAVENOUS | Status: DC
  Administered 2016-06-06: 13:00:00 via INTRAVENOUS

## 2016-06-06 MED ORDER — LIDOCAINE HCL (PF) 1 % IJ SOLN
INTRAMUSCULAR | Status: AC
  Filled 2016-06-06: qty 30

## 2016-06-06 MED ORDER — METHYLPREDNISOLONE SODIUM SUCC 125 MG IJ SOLR
125.0000 mg | INTRAMUSCULAR | Status: AC
  Administered 2016-06-06: 125 mg via INTRAVENOUS
  Filled 2016-06-06: qty 2

## 2016-06-06 MED ORDER — ASPIRIN 81 MG PO CHEW
81.0000 mg | CHEWABLE_TABLET | ORAL | Status: AC
  Administered 2016-06-06: 81 mg via ORAL
  Filled 2016-06-06: qty 1

## 2016-06-06 MED ORDER — MIDAZOLAM HCL 2 MG/2ML IJ SOLN
INTRAMUSCULAR | Status: AC
  Filled 2016-06-06: qty 2

## 2016-06-06 MED ORDER — SODIUM CHLORIDE 0.9 % IV SOLN: 250.0000 mL | INTRAVENOUS | Status: DC | PRN

## 2016-06-06 MED ORDER — FAMOTIDINE IN NACL 20-0.9 MG/50ML-% IV SOLN
20.0000 mg | INTRAVENOUS | Status: AC
  Administered 2016-06-06: 20 mg via INTRAVENOUS
  Filled 2016-06-06: qty 50

## 2016-06-06 MED ORDER — IOPAMIDOL (ISOVUE-370) INJECTION 76%
INTRAVENOUS | Status: AC
  Filled 2016-06-06: qty 100

## 2016-06-06 MED ORDER — HEPARIN (PORCINE) IN NACL 2-0.9 UNIT/ML-% IJ SOLN
INTRAMUSCULAR | Status: AC
  Filled 2016-06-06: qty 1500

## 2016-06-06 MED ORDER — FENTANYL CITRATE (PF) 100 MCG/2ML IJ SOLN
INTRAMUSCULAR | Status: AC
  Filled 2016-06-06: qty 2

## 2016-06-06 MED ORDER — SODIUM CHLORIDE 0.9% FLUSH
3.0000 mL | Freq: Two times a day (BID) | INTRAVENOUS | Status: DC
  Administered 2016-06-06: 3 mL via INTRAVENOUS

## 2016-06-06 MED ORDER — FENTANYL CITRATE (PF) 100 MCG/2ML IJ SOLN
INTRAMUSCULAR | Status: DC | PRN
  Administered 2016-06-06: 25 ug via INTRAVENOUS

## 2016-06-06 MED ORDER — VANCOMYCIN HCL 10 G IV SOLR
1250.0000 mg | INTRAVENOUS | Status: DC
  Filled 2016-06-06: qty 1250

## 2016-06-06 MED ORDER — MIDAZOLAM HCL 2 MG/2ML IJ SOLN
INTRAMUSCULAR | Status: DC | PRN
  Administered 2016-06-06: 1 mg via INTRAVENOUS

## 2016-06-06 MED ORDER — SODIUM CHLORIDE 0.9% FLUSH: 3.0000 mL | Freq: Two times a day (BID) | INTRAVENOUS | Status: DC

## 2016-06-06 MED ORDER — DIPHENHYDRAMINE HCL 50 MG/ML IJ SOLN
25.0000 mg | INTRAMUSCULAR | Status: AC
  Administered 2016-06-06: 25 mg via INTRAVENOUS
  Filled 2016-06-06: qty 1

## 2016-06-06 MED ORDER — IOPAMIDOL (ISOVUE-370) INJECTION 76%
INTRAVENOUS | Status: DC | PRN
  Administered 2016-06-06: 100 mL via INTRAVENOUS

## 2016-06-06 SURGICAL SUPPLY — 14 items
CATH INFINITI 5FR MULTPACK ANG (CATHETERS) ×3 IMPLANT
CATH SWAN GANZ 7F STRAIGHT (CATHETERS) ×3 IMPLANT
CATH SWAN GANZ VIP 7.5F (CATHETERS) IMPLANT
ELECT DEFIB PAD ADLT CADENCE (PAD) ×3 IMPLANT
KIT HEART LEFT (KITS) ×3 IMPLANT
KIT HEART RIGHT NAMIC (KITS) ×3 IMPLANT
PACK CARDIAC CATHETERIZATION (CUSTOM PROCEDURE TRAY) ×3 IMPLANT
SHEATH PINNACLE 5F 10CM (SHEATH) ×3 IMPLANT
SHEATH PINNACLE 7F 10CM (SHEATH) ×3 IMPLANT
SYR MEDRAD MARK V 150ML (SYRINGE) ×3 IMPLANT
TRANSDUCER W/STOPCOCK (MISCELLANEOUS) ×6 IMPLANT
TUBING CIL FLEX 10 FLL-RA (TUBING) ×3 IMPLANT
WIRE EMERALD 3MM-J .025X260CM (WIRE) ×3 IMPLANT
WIRE EMERALD 3MM-J .035X150CM (WIRE) ×3 IMPLANT

## 2016-06-06 NOTE — Consult Note (Addendum)
[address]       [place] [address]             [phone]          CARDIOTHORACIC SURGERY CONSULTATION REPORT  PCP is No PCP Per Patient Referring Provider is Tonny Bollman, MD  Reason for consultation:  Severe aortic stenosis, mitral stenosis and tricuspid regurgitation  HPI:  Patient is a 55 year old obese African-American male with recently discovered complex combination of medical problems including presumably long-standing rheumatic heart disease with severe aortic stenosis, severe mitral stenosis, pulmonary hypertension, severe tricuspid regurgitation, atrial fibrillation, and acute on chronic combined systolic and diastolic congestive heart failure with right heart failure in the setting of dialysis-dependent renal failure and recent positive blood cultures felt presumed secondary to catheter-related infection who has been referred for surgical consultation to inquire whether or not the patient might be candidate for definitive management of his complex valvular heart disease. The patient lives in [place] with his mother. He previously worked loading and unloading trucks but has been out of work and receiving disability for the past 6 years since he developed end-stage renal disease requiring long-term dialysis therapy. The patient states that he was never aware of having problems with heart valve disease of any type until last few months.  He has been hospitalized several times over the past year in [place] and was told that he had aortic stenosis.  Following his most recent hospitalization in [place] he was discharged to a skilled nursing facility. During this time the patient states that he was so weak that he could not get off the couch. He states that blood pressure had been chronically running very low in dialysis. He eventually was discharged home, but he was readmitted to the hospital several days later where he was noted to be short of breath,  hypotensive, anemic, and severely volume overloaded on exam. By report 1 of 2 sets of blood cultures grew MRSA. The patient was transfused 1 unit packed red blood cells and underwent hemodialysis removing 1 L of fluid. He was transferred to  Hospital for further management on 05/31/2016.  His indwelling tunneled hemodialysis catheter was removed for presumed infection.  Follow-up blood cultures have been negative.  Transthoracic echocardiogram performed 06/01/2016 revealed normal left ventricular systolic function was severe concentric hypertrophy and likely significant diastolic dysfunction. There was septal motion pyridoxine secondary to severe right ventricular systolic dysfunction and right ventricular chamber enlargement. There was severe aortic stenosis, mild aortic insufficiency, severe mitral stenosis, and mild mitral regurgitation. There were findings consistent with severe pulmonary hypertension.  CRRT was begun and the patient has undergone successful volume removal over the past week.  The patient underwent left and right heart catheterization earlier today by Dr. Excell Seltzer. He was found to have significant calcification of the coronary arteries but no significant coronary artery occlusive disease. The patient had severe systemic hypotension with central aortic pressure measured only 60/46 while pulmonary artery pressure measured 68/37.  Cardiac output was calculated 5.08 L/m corresponding to cardiac index of 2.32 although mixed venous oxygen saturation was severely reduced at 34% and mean central venous pressure was 20 mmHg.  Cardiothoracic surgical consultation was requested.  The patient is disabled and lives with his mother and sister in [place]. He has been essentially nonambulatory over the past 2-3 months because of severe weakness and shortness of breath. Prior to that he lived a very sedentary lifestyle and ambulated only short distances using  a cane. He reports resting shortness of  breath prior to admission but states that his breathing has improved over the last several days. Similarly, he has had some problems with tightness across his chest and upper abdomen and swelling in both legs, all of which has improved over the last several days. Appetite has been good and bowel function normal. He denies fevers or chills.  Prior to admission he dialyzed on a Tuesday Thursday Saturday schedule at the dialysis center in [place].  He has had multiple attempts to place long-term dialysis access in both arms, all of which have been unsuccessful. Prior to transfer to [facility] the patient was being dialyzed through a tunneled dialysis catheter in the right internal jugular vein.    Past Medical History:  Diagnosis Date  . A-fib (HCC)    with a CHADS-VASC of 2  . Acute on chronic combined systolic and diastolic heart failure (HCC)   . Aortic stenosis 05/24/2016  . CAD (coronary artery disease)    non obstructive   . End stage renal disease (HCC)    on Hemodialysis    . Hyperlipidemia   . Hypertension   . Mitral insufficiency 05/24/2016  . Mitral regurgitation   . Pulmonary hypertension (HCC)   . Right heart failure (HCC)   . Severe anemia    due to recurrent GI bleeds  . Staphylococcus aureus bacteremia   . Tricuspid regurgitation     Past Surgical History:  Procedure Laterality Date  . ESOPHAGOGASTRODUODENOSCOPY    . right arm fistula    . right subclavian permcath      Family History  Problem Relation Age of Onset  . Heart disease Father   . Heart attack Father   . Hypertension Mother   . Diabetes Sister   . Hypertension Sister   . Hypertension Brother     Social History   Social History  . Marital status: Single    Spouse name: N/A  . Number of children: N/A  . Years of education: N/A   Occupational History  . Not on file.   Social History Main Topics  . Smoking status: Former Smoker    Packs/day: 1.50    Quit date: 2007  . Smokeless  tobacco: Never Used  . Alcohol use No  . Drug use: No  . Sexual activity: Not on file   Other Topics Concern  . Not on file   Social History Narrative  . No narrative on file    Prior to Admission medications   Medication Sig Start Date End Date Taking? Authorizing Provider  apixaban (ELIQUIS) 5 MG TABS tablet Take 5 mg by mouth 2 (two) times daily.   Yes Historical Provider, MD  ASPIRIN LOW DOSE 81 MG EC tablet Take 81 mg by mouth daily. 03/24/16  Yes Historical Provider, MD  atorvastatin (LIPITOR) 80 MG tablet Take 80 mg by mouth daily. 05/29/16  Yes Historical Provider, MD  losartan (COZAAR) 25 MG tablet Take 25 mg by mouth daily. 05/29/16  Yes Historical Provider, MD  midodrine (PROAMATINE) 10 MG tablet Take 10 mg by mouth 3 (three) times daily.  05/29/16  Yes Historical Provider, MD  pantoprazole (PROTONIX) 40 MG tablet Take 40 mg by mouth daily. 05/29/16  Yes Historical Provider, MD  RENVELA 800 MG tablet Take 1,600 mg by mouth 3 (three) times daily. 03/24/16  Yes Historical Provider, MD    Current Facility-Administered Medications  Medication Dose Route Frequency Provider Last Rate Last Dose  .  0.9 %  sodium chloride infusion  250 mL Intravenous PRN Erin Fulling, MD      . 0.9 %  sodium chloride infusion  250 mL Intravenous PRN Erin Fulling, MD   Stopped at 06/02/16 0300  . 0.9 %  sodium chloride infusion  250 mL Intravenous PRN Tonny Bollman, MD      . 0.9 %  sodium chloride infusion  250 mL Intravenous PRN Tonny Bollman, MD      . acetaminophen (TYLENOL) tablet 650 mg  650 mg Oral Q4H PRN Erin Fulling, MD      . alteplase (CATHFLO ACTIVASE) injection 2 mg  2 mg Intracatheter Once PRN Delano Metz, MD      . antiseptic oral rinse (CPC / CETYLPYRIDINIUM CHLORIDE 0.05%) solution 7 mL  7 mL Mouth Rinse BID Nelda Bucks, MD   7 mL at 06/06/16 0906  . arformoterol (BROVANA) nebulizer solution 15 mcg  15 mcg Nebulization BID Jose Alexis Frock, MD   15 mcg at 06/06/16 [phone]  .  budesonide (PULMICORT) nebulizer solution 0.5 mg  0.5 mg Nebulization BID Jose Alexis Frock, MD   0.5 mg at 06/06/16 0836  . Darbepoetin Alfa (ARANESP) injection 200 mcg  200 mcg Intravenous Q Sat-HD Delano Metz, MD   200 mcg at 06/04/16 1754  . heparin injection 1,000-6,000 Units  1,000-6,000 Units CRRT PRN Delano Metz, MD   3,000 Units at 06/03/16 0905  . heparin injection 5,000 Units  5,000 Units Subcutaneous Q8H Duayne Cal, NP   5,000 Units at 06/06/16 0620  . heparinized saline (2000 units/L) primer fluid for CRRT   CRRT PRN Delano Metz, MD      . hydrocortisone (ANUSOL-HC) 2.5 % rectal cream   Topical QID PRN Alyson Reedy, MD   1 application at 06/04/16 [phone]  . ondansetron (ZOFRAN) injection 4 mg  4 mg Intravenous Q6H PRN Erin Fulling, MD   4 mg at 06/06/16 0430  . pantoprazole (PROTONIX) EC tablet 40 mg  40 mg Oral QHS Duayne Cal, NP   40 mg at 06/05/16 2210  . prismasol BGK 4/2.5 5,000 mL dialysis replacement fluid   CRRT Continuous Delano Metz, MD 400 mL/hr at 06/06/16 0327    . prismasol BGK 4/2.5 5,000 mL dialysis replacement fluid   CRRT Continuous Delano Metz, MD 200 mL/hr at 06/06/16 0324    . prismasol BGK 4/2.5 5,000 mL dialysis solution   CRRT Continuous Delano Metz, MD 2,000 mL/hr at 06/06/16 1200    . sodium chloride flush (NS) 0.9 % injection 10-40 mL  10-40 mL Intracatheter Q12H Nelda Bucks, MD   10 mL at 06/06/16 0900  . sodium chloride flush (NS) 0.9 % injection 10-40 mL  10-40 mL Intracatheter PRN Nelda Bucks, MD      . sodium chloride flush (NS) 0.9 % injection 3 mL  3 mL Intravenous Q12H Erin Fulling, MD   3 mL at 06/01/16 2200  . sodium chloride flush (NS) 0.9 % injection 3 mL  3 mL Intravenous PRN Erin Fulling, MD      . sodium chloride flush (NS) 0.9 % injection 3 mL  3 mL Intravenous Q12H Tonny Bollman, MD      . sodium chloride flush (NS) 0.9 % injection 3 mL  3 mL Intravenous PRN Tonny Bollman, MD      . sodium chloride  flush (NS) 0.9 % injection 3 mL  3 mL Intravenous Q12H Tonny Bollman, MD      .  sodium chloride flush (NS) 0.9 % injection 3 mL  3 mL Intravenous PRN Tonny Bollman, MD      . traMADol Janean Sark) tablet 50 mg  50 mg Oral Q12H PRN Ok Anis, NP   50 mg at 06/03/16 2005  . [START ON 06/07/2016] vancomycin (VANCOCIN) 1,250 mg in sodium chloride 0.9 % 250 mL IVPB  1,250 mg Intravenous Q48H Silvana Newness, [facility]        Allergies  Allergen Reactions  . Hectorol [Doxercalciferol] Other (See Comments)    unknown  . Iodinated Diagnostic Agents Other (See Comments)    unknown  . Iron Other (See Comments)    unknown  . Betadine [Povidone Iodine] Rash      Review of Systems:   General:  normal appetite, decreased energy, + weight gain, no weight loss, no fever  Cardiac:  + chest pain with exertion, + chest pain at rest, + SOB with exertion, + resting SOB, no PND, + orthopnea, + palpitations, + arrhythmia, + atrial fibrillation, + LE edema, no dizzy spells, no syncope  Respiratory:  + shortness of breath, + home oxygen, no productive cough, + chronic dry cough, no bronchitis, no wheezing, no hemoptysis, no asthma, no pain with inspiration or cough, no sleep apnea, no CPAP at night  GI:   no difficulty swallowing, no reflux, no frequent heartburn, no hiatal hernia, + abdominal pain, no constipation, no diarrhea, no hematochezia, no hematemesis, no melena  GU:   No longer makes urine, no enlarged prostate, no kidney stones, + chronic kidney disease on HD x 6 years  Vascular:  no pain suggestive of claudication, + pain in feet, + leg cramps, no varicose veins, no DVT, no non-healing foot ulcer  Neuro:   no stroke, no TIA's, no seizures, no headaches, no temporary blindness one eye,  no slurred speech, no peripheral neuropathy, no chronic pain, + instability of gait, no memory/cognitive dysfunction  Musculoskeletal: + arthritis - primarily involving the knees, + joint swelling, no myalgias, +  difficulty walking, severely limited mobility   Skin:   no rash, + itching, no skin infections, + pressure sores or ulcerations  Psych:   no anxiety, no depression, no nervousness, no unusual recent stress  Eyes:   no blurry vision, no floaters, no recent vision changes,  wears glasses for reading  ENT:   no hearing loss, + several loose or missing teeth, no dentures, last saw dentist many years ago -   Hematologic:  no easy bruising, no abnormal bleeding, no clotting disorder, no frequent epistaxis  Endocrine:  no diabetes, does not check CBG's at home     Physical Exam:   BP (!) 72/56 (BP Location: Left Arm)   Pulse 97   Temp 97.3 F (36.3 C) (Oral)   Resp (!) 21   Ht 5\' 9"  (1.753 m)   Wt 229 lb 8 oz (104.1 kg)   SpO2 100%   BMI 33.89 kg/m   General:  Obese, chronically ill-appearing  HEENT:  Poor dentition  Neck:   + JVD, no bruits, no adenopathy   Chest:   clear to auscultation, symmetrical breath sounds, no wheezes, no rhonchi   CV:   RRR, grade IV/VI holosystolic murmur   Abdomen:  soft, non-tender, no masses   Extremities:  warm, well-perfused, pulses not palpable, minimal lower extremity edema  Rectal/GU  Deferred  Neuro:   Grossly non-focal and symmetrical throughout  Skin:   Clean and dry, no rashes, no breakdown  Diagnostic Tests:  Transthoracic Echocardiography  Patient:    Reginald Fields, Reginald Fields MR #:       [id number] Study Date: 06/01/2016 Gender:     M Age:        54 Height:     175.3 cm Weight:     123.1 kg BSA:        2.5 m^2 Pt. Status: Room:       [facility]   Dora Sims, MD  REFERRING    Tonny Bollman, MD  ADMITTING    Nelda Bucks  ATTENDING    Nelda Bucks  PERFORMING   [facility], Inpatient  SONOGRAPHER  Sheralyn Boatman  cc:  ------------------------------------------------------------------- LV EF: 65% -   70%  ------------------------------------------------------------------- Indications:      424.1 Aortic valve  disorders.  ------------------------------------------------------------------- History:   PMH:  MR. Shock.  Primary pulmonary hypertension.  Risk factors:  Hypertension.  ------------------------------------------------------------------- Study Conclusions  - Left ventricle: The cavity size was normal. There was severe   concentric hypertrophy. Systolic function was vigorous. The   estimated ejection fraction was in the range of 65% to 70%. Wall   motion was normal; there were no regional wall motion   abnormalities. - Ventricular septum: Septal motion showed paradox. The contour   showed diastolic flattening. - Aortic valve: Valve mobility was restricted. There was severe   stenosis. There was mild regurgitation. Peak velocity (S): 478   cm/s. Mean gradient (S): 47 mm Hg. Valve area (VTI): 0.83 cm^2.   Valve area (Vmax): 0.69 cm^2. Valve area (Vmean): 0.68 cm^2. - Mitral valve: Severely calcified annulus. Severely thickened,   severely calcified leaflets . Mobility was restricted. The   findings are consistent with severe stenosis. Mean gradient (D):   14 mm Hg. Valve area by continuity equation (using LVOT flow):   1.07 cm^2. - Left atrium: The atrium was severely dilated. - Right ventricle: The cavity size was moderately dilated. Wall   thickness was normal. Systolic function was severely reduced. - Right atrium: The atrium was severely dilated. - Tricuspid valve: There was severe regurgitation. - Pulmonary arteries: Systolic pressure was severely increased. PA   peak pressure: 82 mm Hg (S).  Recommendations:  This procedure has been discussed with the referring physician.  ------------------------------------------------------------------- Study data:  No prior study was available for comparison.  Study status:  Routine.  Procedure:  Machine malfunction caused duplicate measurement images. Patient ended exam at apical 3 chamber, said he was too tired. Transthoracic  echocardiography. Image quality was poor. The study was technically difficult, as a result of poor patient compliance and body habitus.  Study completion:  There were no complications.          Transthoracic echocardiography.  M-mode, complete 2D, spectral Doppler, and color Doppler.  Birthdate: Patient birthdate: 12-28-1960.  Age:  Patient is 55 yr old.  Sex: Gender: male.    BMI: 40.1 kg/m^2.  Blood pressure:     68/48 Patient status:  Inpatient.  Study date:  Study date: 06/01/2016. Study time: 12:06 PM.  Location:  ICU/CCU  -------------------------------------------------------------------  ------------------------------------------------------------------- Left ventricle:  The cavity size was normal. There was severe concentric hypertrophy. Systolic function was vigorous. The estimated ejection fraction was in the range of 65% to 70%. Wall motion was normal; there were no regional wall motion abnormalities.  ------------------------------------------------------------------- Aortic valve:   Trileaflet; severely thickened, severely calcified leaflets. Valve mobility was restricted.  Doppler:   There was severe stenosis.  There was mild regurgitation.    VTI ratio of LVOT to aortic valve: 0.28. Valve area (VTI): 0.83 cm^2. Indexed valve area (VTI): 0.33 cm^2/m^2. Peak velocity ratio of LVOT to aortic valve: 0.22. Valve area (Vmax): 0.69 cm^2. Indexed valve area (Vmax): 0.28 cm^2/m^2. Mean velocity ratio of LVOT to aortic valve: 0.22. Valve area (Vmean): 0.68 cm^2. Indexed valve area (Vmean): 0.27 cm^2/m^2.    Mean gradient (S): 47 mm Hg. Peak gradient (S): 91 mm Hg.  ------------------------------------------------------------------- Aorta:  Aortic root: The aortic root was normal in size.  ------------------------------------------------------------------- Mitral valve:   Severely calcified annulus. Severely thickened, severely calcified leaflets . Mobility was  restricted.  Doppler: The findings are consistent with severe stenosis.   There was no regurgitation.    Valve area by continuity equation (using LVOT flow): 1.07 cm^2. Indexed valve area by continuity equation (using LVOT flow): 0.43 cm^2/m^2.    Mean gradient (D): 14 mm Hg. Peak gradient (D): 18 mm Hg.  ------------------------------------------------------------------- Left atrium:  The atrium was severely dilated.  ------------------------------------------------------------------- Right ventricle:  The cavity size was moderately dilated. Wall thickness was normal. Systolic function was severely reduced.  ------------------------------------------------------------------- Ventricular septum:   Septal motion showed paradox. The contour showed diastolic flattening.  ------------------------------------------------------------------- Pulmonic valve:   Poorly visualized.  Structurally normal valve. Cusp separation was normal.  Doppler:  Transvalvular velocity was within the normal range. There was no evidence for stenosis. There was no regurgitation.  ------------------------------------------------------------------- Tricuspid valve:   Structurally normal valve.    Doppler: Transvalvular velocity was increased. There was severe regurgitation.  ------------------------------------------------------------------- Pulmonary artery:   The main pulmonary artery was normal-sized. Systolic pressure was severely increased.  ------------------------------------------------------------------- Right atrium:  The atrium was severely dilated.  ------------------------------------------------------------------- Pericardium:  There was no pericardial effusion.  ------------------------------------------------------------------- Systemic veins:  Not visualized. Inferior vena cava: The vessel was normal in  size.  ------------------------------------------------------------------- Measurements   Left ventricle                            Value          Reference  LV ID, ED, PLAX chordal           (L)     39    mm       43 - 52  LV ID, ES, PLAX chordal           (L)     18    mm       23 - 38  LV fx shortening, PLAX chordal            54    %        >=29  LV PW thickness, ED                       21    mm       ---------  IVS/LV PW ratio, ED                       1.1            <=1.3  Stroke volume, 2D                         76    ml       ---------  Stroke volume/bsa, 2D  30    ml/m^2   ---------  LV end-diastolic volume, 2-p              120   ml       ---------  LV end-diastolic volume/bsa, 2-p          48    ml/m^2   ---------  LV e&', lateral                            7.72  cm/s     ---------  LV E/e&', lateral                          27.59          ---------  LV e&', medial                             8.27  cm/s     ---------  LV E/e&', medial                           25.76          ---------  LV e&', average                            8     cm/s     ---------  LV E/e&', average                          26.64          ---------    Ventricular septum                        Value          Reference  IVS thickness, ED                         23    mm       ---------    LVOT                                      Value          Reference  LVOT ID, S                                20    mm       ---------  LVOT area                                 3.14  cm^2     ---------  LVOT peak velocity, S                     105   cm/s     ---------  LVOT mean velocity, S                     62.4  cm/s     ---------  LVOT VTI, S  24.5  cm       ---------  LVOT peak gradient, S                     4     mm Hg    ---------    Aortic valve                              Value          Reference  Aortic valve peak velocity, S             478   cm/s      ---------  Aortic valve mean velocity, S             287   cm/s     ---------  Aortic valve VTI, S                       87.2  cm       ---------  Aortic mean gradient, S                   47    mm Hg    ---------  Aortic peak gradient, S                   91    mm Hg    ---------  VTI ratio, LVOT/AV                        0.28           ---------  Aortic valve area, VTI                    0.83  cm^2     ---------  Aortic valve area/bsa, VTI                0.33  cm^2/m^2 ---------  Velocity ratio, peak, LVOT/AV             0.22           ---------  Aortic valve area, peak velocity          0.69  cm^2     ---------  Aortic valve area/bsa, peak               0.28  cm^2/m^2 ---------  velocity  Velocity ratio, mean, LVOT/AV             0.22           ---------  Aortic valve area, mean velocity          0.68  cm^2     ---------  Aortic valve area/bsa, mean               0.27  cm^2/m^2 ---------  velocity    Aorta                                     Value          Reference  Aortic root ID, ED                        40    mm       ---------    Left atrium  Value          Reference  LA ID, A-P, ES                            56    mm       ---------  LA ID/bsa, A-P                    (H)     2.24  cm/m^2   <=2.2  LA volume, S                              183   ml       ---------  LA volume/bsa, S                          73.1  ml/m^2   ---------  LA volume, ES, 1-p A4C                    160   ml       ---------  LA volume/bsa, ES, 1-p A4C                63.9  ml/m^2   ---------  LA volume, ES, 1-p A2C                    177   ml       ---------  LA volume/bsa, ES, 1-p A2C                70.7  ml/m^2   ---------    Mitral valve                              Value          Reference  Mitral E-wave peak velocity               213   cm/s     ---------  Mitral A-wave peak velocity               178   cm/s     ---------  Mitral mean velocity, D                   156    cm/s     ---------  Mitral deceleration time          (H)     408   ms       150 - 230  Mitral mean gradient, D                   14    mm Hg    ---------  Mitral peak gradient, D                   18    mm Hg    ---------  Mitral E/A ratio, peak                    1.2            ---------  Mitral valve area, LVOT                   1.07  cm^2     ---------  continuity  Mitral valve area/bsa, LVOT  0.43  cm^2/m^2 ---------  continuity  Mitral annulus VTI, D                     69.6  cm       ---------    Pulmonary arteries                        Value          Reference  PA pressure, S, DP                (H)     82    mm Hg    <=30    Tricuspid valve                           Value          Reference  Tricuspid regurg peak velocity            394   cm/s     ---------  Tricuspid peak RV-RA gradient             62    mm Hg    ---------    Systemic veins                            Value          Reference  Estimated CVP                             20    mm Hg    ---------    Right ventricle                           Value          Reference  TAPSE                                     19.1  mm       ---------  RV pressure, S, DP                (H)     82    mm Hg    <=30  RV s&', lateral, S                         9.14  cm/s     ---------  Legend: (L)  and  (H)  mark values outside specified reference range.  ------------------------------------------------------------------- Prepared and Electronically Authenticated by  Donato Schultz, M.D. 2017-07-26T14:04:55     Right/Left Heart Cath and Coronary Angiography      Prox RCA to Mid RCA lesion, 30 %stenosed.  There is mild (2+) aortic regurgitation.  Hemodynamic findings consistent with severe pulmonary hypertension.   1. Severely calcified but patent coronary arteries 2. Known severe aortic stenosis, severe mitral stenosis, and severe tricuspid regurgitation by echo 3. Severe systemic hypotension with  Central aortic pressure of 60/46 and suprasystemic pulmonary artery pressure of 68/37 with a mean of 50 4. Normal cardiac output and index by Fick calculation (5.08 and 2.32 respectively) but pulmonary artery oxygen saturation of 34% suggestive of low cardiac output (2 separate samples drawn) 5. Calcified aortoiliac vessels but no  significant narrowing  The patient has severe cardiac disease, possibly end-stage with marked systemic hypotension and narrow pulse pressure. He will undergo cardiac surgical evaluation for his valvular heart disease but any intervention would involve extremely high risk.   Indications   Severe aortic stenosis [I35.0 (ICD-10-CM)]  Procedural Details/Technique   Technical Details INDICATION: Congestive heart failure, cardiogenic shock, severe aortic stenosis, severe mitral stenosis, severe tricuspid regurgitation, severe pulmonary hypertension.  PROCEDURAL DETAILS: The right groin was prepped, draped, and anesthetized with 1% lidocaine. Using the modified Seldinger technique a 5 French sheath was placed in the right femoral artery and a 7 French sheath was placed in the right femoral vein. A Swan-Ganz catheter was used for the right heart catheterization. Standard protocol was followed for recording of right heart pressures and sampling of oxygen saturations. Fick cardiac output was calculated. Standard Judkins catheters were used for selective coronary angiography. A pigtail catheter was used to perform aortic root angiography and abdominal aortic angiography. There were no immediate procedural complications. The patient was transferred to the post catheterization recovery area for further monitoring.  During this procedure the patient is administered a total of Versed 1 mg and Fentanyl 25 mg to achieve and maintain moderate conscious sedation. The patient's heart rate, blood pressure, and oxygen saturation are monitored continuously during the procedure. The period of  conscious sedation is 43 minutes, of which I was present face-to-face 100% of this time.   Estimated blood loss <50 mL. .    Coronary Findings   Dominance: Right  Left Main  Large vessel, heavily calcified but widely patent  Left Anterior Descending  There is mild the vessel.  Left Circumflex  There is mild the vessel. The circumflex is severely calcified. There is diffuse nonobstructive disease present.  Right Coronary Artery  Diffusely calcified vessel with no significant stenosis.  Prox RCA to Mid RCA lesion, 30% stenosed. The lesion is calcified.  Right Heart   Right Heart Pressures Hemodynamic findings consistent with severe pulmonary hypertension.    Left Heart   Mitral Valve The mitral annulus has heavy calcification. There is known severe mitral stenosis.    Aortic Valve There is mild (2+) aortic regurgitation. The aortic valve is calcified. There is restricted aortic valve motion. The aortic valve is severely calcified. There is restricted movement of the valve by fluoroscopy. There is mild aortic insufficiency.    Coronary Diagrams   Diagnostic Diagram     Implants     No implant documentation for this case.  PACS Images   Show images for Cardiac catheterization   Link to Procedure Log   Procedure Log    Hemo Data   Flowsheet Row Most Recent Value  Fick Cardiac Output 5.08 L/min  Fick Cardiac Output Index 2.32 (L/min)/BSA  RA A Wave 18 mmHg  RA V Wave 26 mmHg  RA Mean 20 mmHg  RV Systolic Pressure 62 mmHg  RV Diastolic Pressure 7 mmHg  RV EDP 20 mmHg  PA Systolic Pressure 68 mmHg  PA Diastolic Pressure 37 mmHg  PA Mean 50 mmHg  AO Systolic Pressure 60 mmHg  AO Diastolic Pressure 45 mmHg  AO Mean 52 mmHg  QP/QS 1  TPVR Index 21.56 HRUI  TSVR Index 22.43 HRUI  TPVR/TSVR Ratio 0.96      Impression:  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  He presents with acute on chronic combined systolic and  diastolic congestive heart failure with right heart failure, profound chronic hypotension despite severe pulmonary hypertension and cardiogenic shock. I have personally reviewed the patient's recent transthoracic echocardiogram and diagnostic catheterization. Transthoracic echocardiogram reveals severe calcification with restricted leaflet mobility involving all 3 leaflets of the aortic valve and both leaflets of the mitral valve. The degree of calcification is quite extensive and extends through the majority of the base of the heart. There is severe aortic stenosis and severe mitral stenosis. There is severe left ventricular hypertrophy with significant diastolic dysfunction. Left ventricular contractility appears normal but LV function is likely severely impaired by severe right ventricular dysfunction with septal flattening secondary to severe pulmonary hypertension. The patient's mixed venous oxygen saturation is quite low despite central venous pressure of 20 mmHg.  The patient has significant calcification of the coronary arteries but no significant flow-limiting coronary artery disease.  I am concerned that risks associated with aortic valve replacement, mitral valve replacement, and tricuspid valve repair may be prohibitive, primarily because of the presence of severe pulmonary hypertension and RV dysfunction.  Based upon current information I feel the patient would likely be at very high risk for the development of acute right heart failure and/or respiratory failure even following uncomplicated surgery, potentially requiring long-term right ventricular mechanical support and/or ECMO.  Prognosis is quite poor, and it might be most appropriate to consider long-term palliative care as the most appropriate option. However, the patient has apparently made some progress over the past week, he is clearly quite accustomed to  chronically low systemic blood pressure, and he does not currently appear to be in shock on physical exam.   Plan:  I agree with plans to ask the advanced heart failure team to get involved. It is conceivable that selective pulmonary vasodilator therapy might improve the patient's hemodynamics to some degree.  At some point follow-up echocardiogram should be performed, and under the circumstances transesophageal echocardiogram would be useful.  Ultimately it might be useful to consider transfer to a tertiary care facility where ECMO support is available if an aggressive approach were to be entertained.  I have discussed the complex nature of the patient's underlying cardiac valve disease with the patient at the bedside this afternoon. All questions have been answered. We will continue to follow along intermittently.   I spent in excess of 120 minutes during the conduct of this hospital consultation and >50% of this time involved direct face-to-face encounter for counseling and/or coordination of the patient's care.    Salvatore Decent. Cornelius Moras, MD 06/06/2016 6:37 PM

## 2016-06-06 NOTE — Progress Notes (Signed)
[facility] Progress Note   Subjective: seems angry this AM- does not want to be NPO- removed another 2500 last 24 hours  Vitals:   06/06/16 0500 06/06/16 0600 06/06/16 0700 06/06/16 0800  BP: (!) 74/60 (!) 64/35 (!) 73/35 (!) 66/51  Pulse:    86  Resp: Temp:    97.6 F (36.4 C)  TempSrc:    Oral  SpO2:    98%  Weight:  104.1 kg (229 lb 8 oz)    Height:        Inpatient medications: . antiseptic oral rinse  7 mL Mouth Rinse BID  . arformoterol  15 mcg Nebulization BID  . budesonide (PULMICORT) nebulizer solution  0.5 mg Nebulization BID  . darbepoetin (ARANESP) injection - DIALYSIS  200 mcg Intravenous Q Sat-HD  . heparin subcutaneous  5,000 Units Subcutaneous Q8H  . pantoprazole  40 mg Oral QHS  . sodium chloride flush  10-40 mL Intracatheter Q12H  . sodium chloride flush  3 mL Intravenous Q12H  . vancomycin  1,250 mg Intravenous Q24H   . dialysis replacement fluid (prismasate) 400 mL/hr at 06/06/16 0327  . dialysis replacement fluid (prismasate) 200 mL/hr at 06/06/16 0324  . dialysate (PRISMASATE) 2,000 mL/hr at 06/06/16 9528   sodium chloride, sodium chloride, acetaminophen, alteplase, heparin, heparin, hydrocortisone, ondansetron (ZOFRAN) IV, sodium chloride flush, sodium chloride flush, traMADol  Exam: Gen : obesity, chron ill-appearing, angry Chest clear bilat, no rales or wheezing RRR loud 3/6 SEM throughout the upper chest, no RG Abd obese, nontender +bs Ext 1-+ bilat LE edema Neuro is alert, Ox 3 , nf  CXR 7/26 - bilat diffuse pulm edema, moderate severity CXR 7/27 - slight improvement in CHF CXR 7/28 - pending  HD [place]   TTS  5h  400/1.5   106kg  2/2.5 bath   Heparin 7000 w mid Rx 3000 Getting Mircera 200 ug due on Sat 7/29 Getting loading dose of iron   Assessment: 1  Severe AS - for heart cath today 2  Pulm edema/severe vol overload - decreasing vol w CRRT- CVP 14 now- not sure what goal cvp should be for him-  still with peripheral edema but likely third spacing some- will decrease UF to 50 per hour  3  Hypotension, looks chronic/ subacute at min 4  ESRD TTS hd, on HD 6 yrs. Has temp cath in now for HD- most lieky could not support an AV access with that BP 5  MRSA +blood cx from outside hosp- tunneled cath removed 7/26 6  Hist afib, in NSR here   7  Anemia of CKD, cont max ESA here; tsat 14%, but has iron "allergy", will consider alternative IV iron.  Consider transfusion 8. Dispo- pt seems very agitated with everything- I attempted to tell him that there is likely much more to come.   I also fear that even after this Herculean task is complete he will still not tolerate regular HD and continue to have the same issues      Avory Rahimi A  06/06/2016, 8:27 AM    Recent Labs Lab 06/05/16 0418 06/05/16 1515 06/06/16 0350  NA 136 136 137  K 4.2 4.5 4.7  CL 103 103 105  CO2 GLUCOSE 97 96 81  BUN 21* 20 20  CREATININE 2.27* 2.24* 2.06*  CALCIUM 10.3 9.9 9.5  PHOS 3.6 3.5 3.3    Recent Labs Lab 05/31/16 1822  06/05/16 0418 06/05/16  1515 06/06/16 0350  AST 24  --   --   --   --   ALT 11*  --   --   --   --   ALKPHOS 95  --   --   --   --   BILITOT 2.0*  --   --   --   --   PROT 6.3*  --   --   --   --   ALBUMIN 2.9*  < > 2.8* 2.8* 2.7*  < > = values in this interval not displayed.  Recent Labs Lab 05/31/16 1822  06/03/16 0542 06/05/16 0418 06/06/16 0350  WBC 8.3  < > 7.5 8.6 6.8  NEUTROABS 6.9  --   --   --   --   HGB 8.1*  < > 7.5* 7.4* 7.4*  HCT 27.9*  < > 24.9* 26.8* 25.7*  MCV 103.0*  < > 102.0* 106.8* 104.5*  PLT 185  < > 171 185 180  < > = values in this interval not displayed. Iron/TIBC/Ferritin/ %Sat    Component Value Date/Time   IRON 40 (L) 06/03/2016 1228   TIBC 293 06/03/2016 1228   IRONPCTSAT 14 (L) 06/03/2016 1228

## 2016-06-06 NOTE — Progress Notes (Addendum)
Pharmacy Antibiotic Note  Reginald Fields is a 55 y.o. male on vancomycin for concern of MRSA bacteremia (perma-cath cultures 1/2 from [place]). He is noted with ESRD now on CRRT. A vancomycin trough was 25 and above goal. The perma-cath has been removed  7/26 vanc>>  7/25 Blood  > neg/final 7/25 mrsa pcr: +  Plan: -Decrease vancomycin to 1250mg  IV q48h -Consider d/c vancomycin as cultures here are negative   Height: 5\' 9"  (175.3 cm) Weight: 229 lb 8 oz (104.1 kg) IBW/kg (Calculated) : 70.7  Temp (24hrs), Avg:97.5 F (36.4 C), Min:97.3 F (36.3 C), Max:97.8 F (36.6 C)   Recent Labs Lab 05/31/16 1822 06/01/16 0521  06/02/16 0419  06/03/16 0542 06/03/16 1518  06/04/16 0432 06/04/16 1700 06/05/16 0418 06/05/16 1515 06/06/16 0350 06/06/16 1330  WBC 8.3 7.8  --  6.1  --  7.5  --   --   --   --  8.6  --  6.8  --   CREATININE 4.60* 5.13*  < > 4.15*  < >  --   --   < > 2.56* 2.45* 2.27* 2.24* 2.06*  --   LATICACIDVEN 1.8  --   --   --   --   --   --   --   --   --   --   --   --   --   VANCOTROUGH  --   --   --   --   --   --   --   --   --   --   --   --   --  25*  VANCORANDOM  --   --   --   --   --   --  10  --   --   --   --   --  27  --   < > = values in this interval not displayed.  Estimated Creatinine Clearance: 49.3 mL/min (by C-G formula based on SCr of 2.06 mg/dL).    Allergies  Allergen Reactions  . Hectorol [Doxercalciferol] Other (See Comments)    unknown  . Iodinated Diagnostic Agents Other (See Comments)    unknown  . Iron Other (See Comments)    unknown  . Betadine [Povidone Iodine] Rash    Thank you for allowing pharmacy to be a part of this patient's care.  Harland German, Pharm D 06/06/2016 2:25 PM

## 2016-06-06 NOTE — Progress Notes (Addendum)
Site area: Right groin a 5 french arterial and 7 french venous sheath was removed.  Site Prior to Removal:  Level 0  Pressure Applied For 20 MINUTES    Bedrest Beginning at 1750p  Manual:   Yes.    Patient Status During Pull:  stable  Post Pull Groin Site:  Level 0  Post Pull Instructions Given:  Yes.    Post Pull Pulses Present:  Yes.    Dressing Applied:  Yes.    Comments:  VSD remain stable during sheath pull

## 2016-06-06 NOTE — H&P (View-Only) (Signed)
 SUBJECTIVE: No complaints this am.  Appears sleepy. CVP up to 14.  Pressure remains low.  OBJECTIVE:   Vitals:   Vitals:   06/06/16 0800 06/06/16 0836 06/06/16 0900 06/06/16 1000  BP: (!) 66/51  (!) 60/50 (!) 60/48  Pulse: 86     Resp: 18  17 (!) 21  Temp: 97.6 F (36.4 C)     TempSrc: Oral     SpO2: 98% 98%    Weight:      Height:       I&O's:   Intake/Output Summary (Last 24 hours) at 06/06/16 1032 Last data filed at 06/06/16 1000  Gross per 24 hour  Intake              490 ml  Output             2873 ml  Net            -2383 ml   TELEMETRY: Reviewed telemetry pt in NSR:     PHYSICAL EXAM General:well developed well nourished in NAD Head: Eyes PERRLA, No xanthomas.   Normal cephalic and atramatic  Lungs:   Clear bilaterally to auscultation and percussion. Heart:   HRRR S1 S2 Pulses are 2+ & equal.            No carotid bruit. No JVD.  No abdominal bruits. No femoral bruits. Abdomen: Bowel sounds are positive, abdomen soft and non-tender without masses Msk:  Back normal, normal gait. Normal strength and tone for age. Extremities:   No clubbing, cyanosis or edema.  DP +1 Neuro: A&O x 3 Psych:  Good affect, responds appropriately   LABS: Basic Metabolic Panel:  Recent Labs  06/05/16 0418 06/05/16 1515 06/06/16 0350  NA 136 136 137  K 4.2 4.5 4.7  CL 103 103 105  CO2 27 28 26  GLUCOSE 97 96 81  BUN 21* 20 20  CREATININE 2.27* 2.24* 2.06*  CALCIUM 10.3 9.9 9.5  MG 2.4  --  2.3  PHOS 3.6 3.5 3.3   Liver Function Tests:  Recent Labs  06/05/16 1515 06/06/16 0350  ALBUMIN 2.8* 2.7*   No results for input(s): LIPASE, AMYLASE in the last 72 hours. CBC:  Recent Labs  06/05/16 0418 06/06/16 0350  WBC 8.6 6.8  HGB 7.4* 7.4*  HCT 26.8* 25.7*  MCV 106.8* 104.5*  PLT 185 180   Cardiac Enzymes:  Recent Labs  06/03/16 1958 06/04/16 0138 06/04/16 0707  TROPONINI 0.07* 0.07* 0.08*   BNP: Invalid input(s): POCBNP D-Dimer: No results for  input(s): DDIMER in the last 72 hours. Hemoglobin A1C: No results for input(s): HGBA1C in the last 72 hours. Fasting Lipid Panel: No results for input(s): CHOL, HDL, LDLCALC, TRIG, CHOLHDL, LDLDIRECT in the last 72 hours. Thyroid Function Tests: No results for input(s): TSH, T4TOTAL, T3FREE, THYROIDAB in the last 72 hours.  Invalid input(s): FREET3 Anemia Panel:  Recent Labs  06/03/16 1228  TIBC 293  IRON 40*   Coag Panel:   Lab Results  Component Value Date   INR 1.43 06/05/2016   INR 1.47 06/03/2016   INR 1.47 06/02/2016    RADIOLOGY: Dg Chest Port 1 View  Result Date: 06/06/2016 CLINICAL DATA:  Respiratory failure EXAM: PORTABLE CHEST 1 VIEW COMPARISON:  June 04, 2016 FINDINGS: Central catheter tip is at the junction of the left innominate vein and superior vena cava. No pneumothorax. There is interstitial edema throughout both lungs. Mild patchy alveolar edema is present stable. There is no   new opacity. There is cardiomegaly with pulmonary venous hypertension. No adenopathy evident. IMPRESSION: Stable changes of congestive heart failure. In particular, the distribution and degree of pulmonary edema stable. Cardiac silhouette is stable. No pneumothorax. No change in central catheter position. Electronically Signed   By: William  Woodruff III M.D.   On: 06/06/2016 07:19  Dg Chest Port 1 View  Result Date: 06/04/2016 CLINICAL DATA:  Pulmonary edema. EXAM: PORTABLE CHEST 1 VIEW COMPARISON:  Chest x-rays dated 06/03/2016 and 06/02/2016. FINDINGS: Cardiomegaly is stable. Overall cardiomediastinal silhouette is stable in size and configuration. Left IJ catheter stable in position with tip at the upper margin of the SVC. Bilateral pulmonary edema is stable. Small bilateral pleural effusions are stable. No new lung findings. IMPRESSION: Stable exam. Pulmonary edema is unchanged. Small bilateral pleural effusions again noted. Cardiomegaly is stable. Electronically Signed   By: Stan  Maynard  M.D.   On: 06/04/2016 17:45  Dg Chest Port 1 View  Result Date: 06/03/2016 CLINICAL DATA:  Pulmonary edema. EXAM: PORTABLE CHEST 1 VIEW COMPARISON:  06/02/2016. FINDINGS: Left IJ line in stable position. Cardiomegaly with bilateral pulmonary infiltrates consistent with pulmonary edema. Bilateral pleural effusions are noted. No interim change from prior exam. No pneumothorax. IMPRESSION: 1. Left IJ line stable position. 2. Stable cardiomegaly with bilateral pulmonary edema and bilateral pleural effusions. No interim change from prior exam . Electronically Signed   By: Thomas  Register   On: 06/03/2016 17:28  Dg Chest Port 1 View  Result Date: 06/02/2016 CLINICAL DATA:  Pulmonary edema, valvular heart disease, dialysis dependent renal failure. EXAM: PORTABLE CHEST 1 VIEW COMPARISON:  Portable chest x-ray of June 01, 2016 FINDINGS: The lungs are better inflated today. Confluent alveolar opacities persist greatest on the right. Small amounts of pleural fluid layer along the right lateral thoracic wall. The cardiac silhouette remains enlarged. The pulmonary vascularity remains engorged but is slightly more distinct. The left internal jugular venous catheter tip projects over the junction of the right and left brachiocephalic veins. IMPRESSION: Slight interval improvement in pulmonary interstitial and alveolar edema. Stable small right pleural effusion. Electronically Signed   By: David  Jordan M.D.   On: 06/02/2016 07:07  Dg Chest Portable 1 View  Result Date: 06/01/2016 CLINICAL DATA:  Central line placement EXAM: PORTABLE CHEST 1 VIEW COMPARISON:  06/01/2016 FINDINGS: Diffuse interstitial thickening with patchy areas of alveolar airspace opacity. No pleural effusion or pneumothorax. Stable cardiomegaly. Left jugular dual-lumen central venous catheter with the tip at the confluence of the left brachiocephalic and SVC. No acute osseous abnormality. IMPRESSION: 1. Left jugular dual-lumen central venous  catheter with the tip at the confluence of the left brachiocephalic and SVC. 2. Mild CHF. Electronically Signed   By: Hetal  Patel   On: 06/01/2016 17:26  Dg Chest Port 1 View  Result Date: 06/01/2016 CLINICAL DATA:  Shortness of breath. EXAM: PORTABLE CHEST 1 VIEW COMPARISON:  No recent prior. FINDINGS: Right IJ sheath noted with tip projected over right atrium. Cardiomegaly with diffuse pulmonary infiltrates consistent pulmonary edema. Small bilateral effusions. No pneumothorax. IMPRESSION: 1. Right IJ sheath noted with tip projected over the right atrium. 2. Cardiomegaly with bilateral pulmonary infiltrates consistent pulmonary edema. Bilateral pleural effusions. Electronically Signed   By: Thomas  Register   On: 06/01/2016 06:52  Assessment/Plan:  1. Hypotension/Cardiogenic shock, acute on chronic 2. Acute on chronic diastolic heart failure, NYHA functional class IV    --EF 65-70% on echo 7/26.  He is net neg 24L.  Weight down 4   lbs from yesterday.  Volume status much improved with CVVHD but remains volume overload (CVP 18). Would continue to pull but be mindful that he is preload dependent with severe AS. SBP currently 60-70. Would have low threshold to support with levophed. Will defer to [facility] as they know him well and have been managing BP SBP 60-70 apparently chronic for him)  3. Severe aortic stenosis 4. End-stage renal disease - per nephrology - currently on CVVHD 5. Anemia, probable acute on chronic with hemoglobin documented at 6.9 on July 23, 7.4 mg/dL today 6. Elevated troponin likely demand ischemia - trend is flat 7. Recent upper extremity DVT  8. Severe mitral and aortic stenosis 9. Severe tricuspid regurgitation 10. Severe pulmonary hypertension 11. MRSA bacteremia on bcx from OSH - Vascath pulled  Plan R/L cath today.  Cardiac surgical consultation pending post-cath once all data is complete. He may require triple valve surgery and he will be at extremely high-risk with his  severe comorbid conditions and debility.   The patient is critically ill with multiple organ systems failure and requires high complexity decision making for assessment and support, frequent evaluation and titration of therapies, application of advanced monitoring technologies and extensive interpretation of multiple databases. Critical Care Time devoted to patient care services described in this note independent of APP time is 35 minutes with >50% of time spent in direct patient care.      Marsella Suman, MD  06/06/2016  10:32 AM 

## 2016-06-06 NOTE — Progress Notes (Addendum)
SUBJECTIVE: No complaints this am.  Appears sleepy. CVP up to 14.  Pressure remains low.  OBJECTIVE:   Vitals:   Vitals:   06/06/16 0800 06/06/16 0836 06/06/16 0900 06/06/16 1000  BP: (!) 66/51  (!) 60/50 (!) 60/48  Pulse: 86     Resp: 18  17 (!) 21  Temp: 97.6 F (36.4 C)     TempSrc: Oral     SpO2: 98% 98%    Weight:      Height:       I&O's:   Intake/Output Summary (Last 24 hours) at 06/06/16 1032 Last data filed at 06/06/16 1000  Gross per 24 hour  Intake              490 ml  Output             2873 ml  Net            -2383 ml   TELEMETRY: Reviewed telemetry pt in NSR:     PHYSICAL EXAM General:well developed well nourished in NAD Head: Eyes PERRLA, No xanthomas.   Normal cephalic and atramatic  Lungs:   Clear bilaterally to auscultation and percussion. Heart:   HRRR S1 S2 Pulses are 2+ & equal.            No carotid bruit. No JVD.  No abdominal bruits. No femoral bruits. Abdomen: Bowel sounds are positive, abdomen soft and non-tender without masses Msk:  Back normal, normal gait. Normal strength and tone for age. Extremities:   No clubbing, cyanosis or edema.  DP +1 Neuro: A&O x 3 Psych:  Good affect, responds appropriately   LABS: Basic Metabolic Panel:  Recent Labs  16/10/96 0418 06/05/16 1515 06/06/16 0350  NA 136 136 137  K 4.2 4.5 4.7  CL 103 103 105  CO2 27 28 26   GLUCOSE 97 96 81  BUN 21* 20 20  CREATININE 2.27* 2.24* 2.06*  CALCIUM 10.3 9.9 9.5  MG 2.4  --  2.3  PHOS 3.6 3.5 3.3   Liver Function Tests:  Recent Labs  06/05/16 1515 06/06/16 0350  ALBUMIN 2.8* 2.7*   No results for input(s): LIPASE, AMYLASE in the last 72 hours. CBC:  Recent Labs  06/05/16 0418 06/06/16 0350  WBC 8.6 6.8  HGB 7.4* 7.4*  HCT 26.8* 25.7*  MCV 106.8* 104.5*  PLT 185 180   Cardiac Enzymes:  Recent Labs  06/03/16 1958 06/04/16 0138 06/04/16 0707  TROPONINI 0.07* 0.07* 0.08*   BNP: Invalid input(s): POCBNP D-Dimer: No results for  input(s): DDIMER in the last 72 hours. Hemoglobin A1C: No results for input(s): HGBA1C in the last 72 hours. Fasting Lipid Panel: No results for input(s): CHOL, HDL, LDLCALC, TRIG, CHOLHDL, LDLDIRECT in the last 72 hours. Thyroid Function Tests: No results for input(s): TSH, T4TOTAL, T3FREE, THYROIDAB in the last 72 hours.  Invalid input(s): FREET3 Anemia Panel:  Recent Labs  06/03/16 1228  TIBC 293  IRON 40*   Coag Panel:   Lab Results  Component Value Date   INR 1.43 06/05/2016   INR 1.47 06/03/2016   INR 1.47 06/02/2016    RADIOLOGY: Dg Chest Port 1 View  Result Date: 06/06/2016 CLINICAL DATA:  Respiratory failure EXAM: PORTABLE CHEST 1 VIEW COMPARISON:  June 04, 2016 FINDINGS: Central catheter tip is at the junction of the left innominate vein and superior vena cava. No pneumothorax. There is interstitial edema throughout both lungs. Mild patchy alveolar edema is present stable. There is no  new opacity. There is cardiomegaly with pulmonary venous hypertension. No adenopathy evident. IMPRESSION: Stable changes of congestive heart failure. In particular, the distribution and degree of pulmonary edema stable. Cardiac silhouette is stable. No pneumothorax. No change in central catheter position. Electronically Signed   By: Bretta Bang III M.D.   On: 06/06/2016 07:19  Dg Chest Port 1 View  Result Date: 06/04/2016 CLINICAL DATA:  Pulmonary edema. EXAM: PORTABLE CHEST 1 VIEW COMPARISON:  Chest x-rays dated 06/03/2016 and 06/02/2016. FINDINGS: Cardiomegaly is stable. Overall cardiomediastinal silhouette is stable in size and configuration. Left IJ catheter stable in position with tip at the upper margin of the SVC. Bilateral pulmonary edema is stable. Small bilateral pleural effusions are stable. No new lung findings. IMPRESSION: Stable exam. Pulmonary edema is unchanged. Small bilateral pleural effusions again noted. Cardiomegaly is stable. Electronically Signed   By: Bary Richard  M.D.   On: 06/04/2016 17:45  Dg Chest Port 1 View  Result Date: 06/03/2016 CLINICAL DATA:  Pulmonary edema. EXAM: PORTABLE CHEST 1 VIEW COMPARISON:  06/02/2016. FINDINGS: Left IJ line in stable position. Cardiomegaly with bilateral pulmonary infiltrates consistent with pulmonary edema. Bilateral pleural effusions are noted. No interim change from prior exam. No pneumothorax. IMPRESSION: 1. Left IJ line stable position. 2. Stable cardiomegaly with bilateral pulmonary edema and bilateral pleural effusions. No interim change from prior exam . Electronically Signed   By: Maisie Fus  Register   On: 06/03/2016 17:28  Dg Chest Port 1 View  Result Date: 06/02/2016 CLINICAL DATA:  Pulmonary edema, valvular heart disease, dialysis dependent renal failure. EXAM: PORTABLE CHEST 1 VIEW COMPARISON:  Portable chest x-ray of June 01, 2016 FINDINGS: The lungs are better inflated today. Confluent alveolar opacities persist greatest on the right. Small amounts of pleural fluid layer along the right lateral thoracic wall. The cardiac silhouette remains enlarged. The pulmonary vascularity remains engorged but is slightly more distinct. The left internal jugular venous catheter tip projects over the junction of the right and left brachiocephalic veins. IMPRESSION: Slight interval improvement in pulmonary interstitial and alveolar edema. Stable small right pleural effusion. Electronically Signed   By: David  [place] M.D.   On: 06/02/2016 07:07  Dg Chest Portable 1 View  Result Date: 06/01/2016 CLINICAL DATA:  Central line placement EXAM: PORTABLE CHEST 1 VIEW COMPARISON:  06/01/2016 FINDINGS: Diffuse interstitial thickening with patchy areas of alveolar airspace opacity. No pleural effusion or pneumothorax. Stable cardiomegaly. Left jugular dual-lumen central venous catheter with the tip at the confluence of the left brachiocephalic and SVC. No acute osseous abnormality. IMPRESSION: 1. Left jugular dual-lumen central venous  catheter with the tip at the confluence of the left brachiocephalic and SVC. 2. Mild CHF. Electronically Signed   By: Elige Ko   On: 06/01/2016 17:26  Dg Chest Port 1 View  Result Date: 06/01/2016 CLINICAL DATA:  Shortness of breath. EXAM: PORTABLE CHEST 1 VIEW COMPARISON:  No recent prior. FINDINGS: Right IJ sheath noted with tip projected over right atrium. Cardiomegaly with diffuse pulmonary infiltrates consistent pulmonary edema. Small bilateral effusions. No pneumothorax. IMPRESSION: 1. Right IJ sheath noted with tip projected over the right atrium. 2. Cardiomegaly with bilateral pulmonary infiltrates consistent pulmonary edema. Bilateral pleural effusions. Electronically Signed   By: Maisie Fus  Register   On: 06/01/2016 06:52  Assessment/Plan:  1. Hypotension/Cardiogenic shock, acute on chronic 2. Acute on chronic diastolic heart failure, NYHA functional class IV    --EF 65-70% on echo 7/26.  He is net neg 24L.  Weight down 4  lbs from yesterday.  Volume status much improved with CVVHD but remains volume overload (CVP 18). Would continue to pull but be mindful that he is preload dependent with severe AS. SBP currently 60-70. Would have low threshold to support with levophed. Will defer to Renal as they know him well and have been managing BP SBP 60-70 apparently chronic for him)  3. Severe aortic stenosis 4. End-stage renal disease - per nephrology - currently on CVVHD 5. Anemia, probable acute on chronic with hemoglobin documented at 6.9 on July 23, 7.4 mg/dL today 6. Elevated troponin likely demand ischemia - trend is flat 7. Recent upper extremity DVT  8. Severe mitral and aortic stenosis 9. Severe tricuspid regurgitation 10. Severe pulmonary hypertension 11. MRSA bacteremia on bcx from OSH - Vascath pulled  Plan R/L cath today.  Cardiac surgical consultation pending post-cath once all data is complete. He may require triple valve surgery and he will be at extremely high-risk with his  severe comorbid conditions and debility.   The patient is critically ill with multiple organ systems failure and requires high complexity decision making for assessment and support, frequent evaluation and titration of therapies, application of advanced monitoring technologies and extensive interpretation of multiple databases. Critical Care Time devoted to patient care services described in this note independent of APP time is 35 minutes with >50% of time spent in direct patient care.      Armanda Magic, MD  06/06/2016  10:32 AM

## 2016-06-06 NOTE — Progress Notes (Signed)
PULMONARY / CRITICAL CARE MEDICINE   Name: Reginald Fields MRN: [id number] DOB: 1961-07-19    ADMISSION DATE:  05/31/2016 CONSULTATION DATE:  05/31/2016  REFERRING MD:  [facility] MD  CHIEF COMPLAINT:  SOB  HISTORY OF PRESENT ILLNESS:   55 year old male with PMH as below, which includes known severe AS, COPD (on chronic 3L O2), atrial fibrillation, ESRD (on HD TTS), HTN, PAH, and bacteremia. He was admitted to [facility] with complaints of SOB and bilateral edema which have been progressive for 3 months. He has been more hypotensive recently which has limited HD to an extent. Also beat blockers have been stopped. He underwent HD 4/24 with removal of 1L. He also required 1 unit PRBC transfusion. HD limited by hypotension. 7/25 he was still having SOB and pain in the legs and remained hypotensive. He was transferred to ICU. SOB thought to be from systolic CHF secondary to severe AS. Patient is followed by CVTS in [place], so he has been transferred to [facility] for further evaluation.    SUBJECTIVE:   Remains on CVVH with neg 100cc/hr. Mental status is unchanged Plan for cardiac cath today.   VITAL SIGNS: BP (!) 70/60   Pulse 95   Temp 97.4 F (36.3 C) (Oral)   Resp 18   Ht  (1.753 m)   Wt 230 lb 13.2 oz (104.7 kg)   SpO2 96%   BMI 34.09 kg/m   HEMODYNAMICS: CVP:  [14 mmHg-19 mmHg] 15 mmHg  VENTILATOR SETTINGS:    INTAKE / OUTPUT: I/O last 3 completed shifts: In: 1290 [P.O.:780; I.V.:10; IV Piggyback:500] Out: 7957 [Other:7957]  PHYSICAL EXAMINATION: General:  Obese male in no distress. Comfortable. Tolerating CVVH Neuro:  Alert, oriented, not in distress.  No focal deficits  HEENT:  [place]/AT, PERRL, no appreciable JVD, L IJ HD cath clean Cardiovascular:  RRR, 4/6 systolic murmur Lungs:  Clear, no wheeze or crackles.   Abdomen:  Soft, non-tender, non-distended Musculoskeletal:  No acute deformity or ROM limitation. Trace edema   Skin:  Grossly  intact  LABS:  BMET  Recent Labs Lab 06/04/16 1700 06/05/16 0418 06/05/16 1515  NA 136 136 136  K 4.3 4.2 4.5  CL 103 103 103  CO2 BUN 23* 21* 20  CREATININE 2.45* 2.27* 2.24*  GLUCOSE 113* 97 96    Electrolytes  Recent Labs Lab 06/03/16 0422  06/04/16 0138  06/04/16 1700 06/05/16 0418 06/05/16 1515  CALCIUM  --   < >  --   < > 10.4* 10.3 9.9  MG 2.3  --  2.5*  --   --  2.4  --   PHOS  --   < >  --   < > 4.1 3.6 3.5  < > = values in this interval not displayed.  CBC  Recent Labs Lab 06/02/16 0419 06/03/16 0542 06/05/16 0418  WBC 6.1 7.5 8.6  HGB 7.4* 7.5* 7.4*  HCT 24.6* 24.9* 26.8*  PLT 179 171 185    Coag's  Recent Labs Lab 06/02/16 1508  06/03/16 0517 06/04/16 0433 06/05/16 0418 06/05/16 1600  APTT  --   < >  --  37* 35 36  INR 1.47  --  1.47  --   --  1.43  < > = values in this interval not displayed.  Sepsis Markers  Recent Labs Lab 05/31/16 1822  LATICACIDVEN 1.8  PROCALCITON 0.91    ABG No results for input(s): PHART, PCO2ART, PO2ART  in the last 168 hours.  Liver Enzymes  Recent Labs Lab 05/31/16 1822  06/04/16 1700 06/05/16 0418 06/05/16 1515  AST 24  --   --   --   --   ALT 11*  --   --   --   --   ALKPHOS 95  --   --   --   --   BILITOT 2.0*  --   --   --   --   ALBUMIN 2.9*  < > 2.9* 2.8* 2.8*  < > = values in this interval not displayed.  Cardiac Enzymes  Recent Labs Lab 06/03/16 1958 06/04/16 0138 06/04/16 0707  TROPONINI 0.07* 0.07* 0.08*    Glucose No results for input(s): GLUCAP in the last 168 hours.  Imaging No results found.   STUDIES:  Echo 7/7: LVEF 50-55%, PA pressures 72-60mmHg, severe AS, moderate MS, mod/sev TR.   CULTURES: Blood culture from perm-cath from OSH [facility] ) > (-) as of 7/27 Blood 7/25 > negative MRSA 7/25 (+)  ANTIBIOTICS: Vanc 7/26 >   SIGNIFICANT EVENTS: 7/25  Transfer from [place] for CVVH. Pt with severe valvular heart dse with volume overload and  CKD 5.  7/29  CVVHD continues, net neg 16.5L.  Episode chest pain overnight, resolved.  LINES/TUBES: RIJ perm cath >> dc'd on 7/26 L IJ HD 7/26   ASSESSMENT / PLAN:  PULMONARY A: Acute on chronic hypoxemic respiratory failure secondary to pulmonary edema/volume overload in a setting of severe AS, MS, pulm HTN COPD without acute exacerbation  P:   On going CVVH for volume removal > tolerating with BP 50-70 systolic (chronic).   Supplemental O2 for sats > 92% Scheduled and PRN nebs Pulmonary hygiene Incentive spirometry   CARDIOVASCULAR A:  Acute on chronic systolic/diastolic CHF Severe aortic stenosis, mitral stenosis, TR pulm HTN Hypotension > chronic per pt baseline SBP in 70s. (LA, cortisol WNL)  P:  Planned LHC/RHC on 7/31 Cards following Telemetry monitoring Will tolerate hypotension for now as mental status intact, consider pressors if mental status changes. Marland Kitchen   RENAL A:   ESRD on HD TTS  P:   Cont CVVH. Renal following.  Continue negative balance as tolerated, monitor closely with severe AS   GASTROINTESTINAL A:   No acute issues  P:   Renal diet with fluid restriction Protonix for SUP  HEMATOLOGIC A:   Anemia of chronic illness  P:  Follow Heparin SQ for VTE ppx  INFECTIOUS A:   MRSA bacteremia considered. (+) in 1/2 bottles (this bottle from permcath) from OSH allegedly. [facility] hospital on 7/27 and so far, all blood cultures have been (-).   P:   Perm cath was dc'd on 7/26 2/2 suspected line infection.  Cont vanc for now until official report from [place] is in > as of 7/27, all blood cultures from [place] were negative.    ENDOCRINE A:   No acute issues   P:   Follow glucose on BMP  NEUROLOGIC A:   No acute issues  P:   Monitor / supportive care  FAMILY  - Updates: Patient updated bedside 7/30 - Inter-disciplinary family meet or Palliative Care meeting due by:  8/1  Critical care time- 35 mins.  Chilton Greathouse MD [facility] Pager [phone] If no answer or after 3pm call: [phone] 06/06/2016, 4:34 AM

## 2016-06-06 NOTE — Interval H&P Note (Signed)
History and Physical Interval Note:  06/06/2016 4:21 PM  Reginald Fields  has presented today for surgery, with the diagnosis of cp  The various methods of treatment have been discussed with the patient and family. After consideration of risks, benefits and other options for treatment, the patient has consented to  Procedure(s): Right/Left Heart Cath and Coronary Angiography (N/A) as a surgical intervention .  The patient's history has been reviewed, patient examined, no change in status, stable for surgery.  I have reviewed the patient's chart and labs.  Questions were answered to the patient's satisfaction.     Tonny Bollman

## 2016-06-07 ENCOUNTER — Inpatient Hospital Stay (HOSPITAL_COMMUNITY): Payer: Medicare Other

## 2016-06-07 ENCOUNTER — Encounter (HOSPITAL_COMMUNITY): Payer: Self-pay | Admitting: Cardiovascular Disease

## 2016-06-07 ENCOUNTER — Encounter (HOSPITAL_COMMUNITY): Payer: Medicare Other

## 2016-06-07 LAB — CBC
HCT: 27.5 % — ABNORMAL LOW (ref 39.0–52.0)
Hemoglobin: 8 g/dL — ABNORMAL LOW (ref 13.0–17.0)
MCH: 30.2 pg (ref 26.0–34.0)
MCHC: 29.1 g/dL — AB (ref 30.0–36.0)
MCV: 103.8 fL — ABNORMAL HIGH (ref 78.0–100.0)
Platelets: 218 10*3/uL (ref 150–400)
RBC: 2.65 MIL/uL — ABNORMAL LOW (ref 4.22–5.81)
RDW: 18.4 % — AB (ref 11.5–15.5)
WBC: 7.2 10*3/uL (ref 4.0–10.5)

## 2016-06-07 LAB — RENAL FUNCTION PANEL
ALBUMIN: 2.7 g/dL — AB (ref 3.5–5.0)
ALBUMIN: 2.7 g/dL — AB (ref 3.5–5.0)
Anion gap: 5 (ref 5–15)
Anion gap: 5 (ref 5–15)
BUN: 22 mg/dL — ABNORMAL HIGH (ref 6–20)
BUN: 21 mg/dL — AB (ref 6–20)
CALCIUM: 9.5 mg/dL (ref 8.9–10.3)
CALCIUM: 10.4 mg/dL — AB (ref 8.9–10.3)
CHLORIDE: 105 mmol/L (ref 101–111)
CO2: 26 mmol/L (ref 22–32)
CO2: 28 mmol/L (ref 22–32)
CREATININE: 2.17 mg/dL — AB (ref 0.61–1.24)
CREATININE: 2.06 mg/dL — AB (ref 0.61–1.24)
Chloride: 106 mmol/L (ref 101–111)
GFR calc Af Amer: 41 mL/min — ABNORMAL LOW (ref >60)
GFR, EST AFRICAN AMERICAN: 38 mL/min — AB (ref >60)
GFR, EST NON AFRICAN AMERICAN: 33 mL/min — AB (ref >60)
GFR, EST NON AFRICAN AMERICAN: 35 mL/min — AB (ref >60)
Glucose, Bld: 142 mg/dL — ABNORMAL HIGH (ref 65–99)
Glucose, Bld: 100 mg/dL — ABNORMAL HIGH (ref 65–99)
PHOSPHORUS: 3.5 mg/dL (ref 2.5–4.6)
PHOSPHORUS: 3.7 mg/dL (ref 2.5–4.6)
POTASSIUM: 4.8 mmol/L (ref 3.5–5.1)
Potassium: 5 mmol/L (ref 3.5–5.1)
SODIUM: 136 mmol/L (ref 135–145)
SODIUM: 139 mmol/L (ref 135–145)

## 2016-06-07 LAB — BASIC METABOLIC PANEL
Anion gap: 5 (ref 5–15)
BUN: 22 mg/dL — AB (ref 6–20)
CALCIUM: 9.6 mg/dL (ref 8.9–10.3)
CO2: 25 mmol/L (ref 22–32)
CREATININE: 2.15 mg/dL — AB (ref 0.61–1.24)
Chloride: 106 mmol/L (ref 101–111)
GFR calc non Af Amer: 33 mL/min — ABNORMAL LOW (ref >60)
GFR, EST AFRICAN AMERICAN: 39 mL/min — AB (ref >60)
Glucose, Bld: 143 mg/dL — ABNORMAL HIGH (ref 65–99)
Potassium: 5.1 mmol/L (ref 3.5–5.1)
SODIUM: 136 mmol/L (ref 135–145)

## 2016-06-07 LAB — MAGNESIUM: MAGNESIUM: 2.5 mg/dL — AB (ref 1.7–2.4)

## 2016-06-07 LAB — APTT: APTT: 36 s (ref 24–36)

## 2016-06-07 MED ORDER — VANCOMYCIN HCL 10 G IV SOLR
1250.0000 mg | INTRAVENOUS | Status: DC
  Administered 2016-06-07: 1250 mg via INTRAVENOUS
  Filled 2016-06-07 (×2): qty 1250

## 2016-06-07 MED FILL — Lidocaine HCl Local Preservative Free (PF) Inj 1%: INTRAMUSCULAR | Qty: 30 | Status: AC

## 2016-06-07 MED FILL — Heparin Sodium (Porcine) 2 Unit/ML in Sodium Chloride 0.9%: INTRAMUSCULAR | Qty: 1500 | Status: AC

## 2016-06-07 NOTE — Consult Note (Signed)
Consultation Note Date: 06/07/2016   Patient Name: Reginald Fields  DOB: April 09, 1961  MRN: [id number]  Age / Sex: 55 y.o., male  PCP: No Pcp Per Patient Referring Physician: Raylene Miyamoto, MD  Reason for Consultation: Establishing goals of care  HPI/Patient Profile: 55 y.o. male  with past medical history of severe AS, COPD (on chronic 3L O2), atrial fibrillation, ESRD (on HD TTS), HTN, PAH, and potential bacteremia admitted on 05/31/2016 with progressive SOB and BLE edema x 3 months. Hypotensive and he has been initiated on CRRT. Unlikely to tolerate intermittent dialysis at this point. Had cardiac cath 7/31 finding severely calcified coronary arteries, severe aortic stenosis, severe mitral stenosis, severe tricuspid regurgitation, severe pulmonary hypertension. Cardiology has f/u with [facility] for any treatment options but no surgical options here or at [facility] being offered.   Clinical Assessment and Goals of Care: I met today with Mr. Kazlauskas along with his mother, sister, brother, and nephew at bedside. His They all understand how sick Mr. Luger is and are supportive of his decision to go home. Discussed the need for hospice to manage symptoms and provide support at end of life and they all agree. They understand that he will be unable to continue dialysis as before and that CRRT will be stopped prior to discharge. He has no current symptoms to manage.   They understand that there are really no more options and Mr. Oldaker is clear about his desire to go home and spend whatever time he has left with his family. He does not wish to continue in the hospital. He is visibly much happier and comfortable in their presence. They are extremely supportive of these decisions and plan to help support him at home with the help of hospice. He did not desire to know prognosis.   NEXT OF KIN mother and sister are main support but  he is making his own decisions.     SUMMARY OF RECOMMENDATIONS   Hopeful for home with hospice tomorrow.   Code Status/Advance Care Planning:  DNR   Symptom Management:   No current symptoms. OxyFast ordered for pain/dyspnea and may be utilized and titrated at home as needed with hospice care as he progresses and becomes more symptomatic.   Palliative Prophylaxis:   Delirium Protocol  Additional Recommendations (Limitations, Scope, Preferences):  Full Comfort Care when discharged  Psycho-social/Spiritual:   Desire for further Chaplaincy support:no  Additional Recommendations: Caregiving  Support/Resources and Education on Hospice  Prognosis:   < 2 weeks  Discharge Planning: Home with Hospice      Primary Diagnoses: Present on Admission: . Shock ([place]) . Acute pulmonary edema (H[facility]) . CKD (chronic kidney disease) . Acute respiratory failure ([place]) . Pulmonary hypertension ([place]) . Mitral regurgitation . Mitral insufficiency . Hypertension . Tricuspid regurgitation . Acute on chronic combined systolic and diastolic heart failure ([place])   I have reviewed the medical record, interviewed the patient and family, and examined the patient. The following aspects are pertinent.  Past Medical History:  Diagnosis Date  .  A-fib ([place])    with a CHADS-VASC of 2  . Acute on chronic combined systolic and diastolic heart failure ([place])   . Aortic stenosis 05/24/2016  . CAD (coronary artery disease)    non obstructive   . End stage renal disease ([place])    on Hemodialysis    . Hyperlipidemia   . Hypertension   . Mitral insufficiency 05/24/2016  . Mitral regurgitation   . Pulmonary hypertension ([place])   . Right heart failure ([place])   . Severe anemia    due to recurrent GI bleeds  . Staphylococcus aureus bacteremia   . Tricuspid regurgitation    Social History   Social History  . Marital status: Single    Spouse name: N/A  . Number of children: N/A  . Years of  education: N/A   Social History Main Topics  . Smoking status: Former Smoker    Packs/day: 1.50    Quit date: 2007  . Smokeless tobacco: Never Used  . Alcohol use No  . Drug use: No  . Sexual activity: Not Asked   Other Topics Concern  . None   Social History Narrative  . None   Family History  Problem Relation Age of Onset  . Heart disease Father   . Heart attack Father   . Hypertension Mother   . Diabetes Sister   . Hypertension Sister   . Hypertension Brother    Scheduled Meds: . antiseptic oral rinse  7 mL Mouth Rinse BID  . arformoterol  15 mcg Nebulization BID  . budesonide (PULMICORT) nebulizer solution  0.5 mg Nebulization BID  . darbepoetin (ARANESP) injection - DIALYSIS  200 mcg Intravenous Q Sat-HD  . heparin subcutaneous  5,000 Units Subcutaneous Q8H  . pantoprazole  40 mg Oral QHS  . sodium chloride flush  10-40 mL Intracatheter Q12H  . sodium chloride flush  3 mL Intravenous Q12H  . vancomycin  1,250 mg Intravenous Q48H   Continuous Infusions: . dialysis replacement fluid (prismasate) 400 mL/hr at 06/07/16 1128  . dialysis replacement fluid (prismasate) 200 mL/hr at 06/07/16 1149  . dialysate (PRISMASATE) 2,000 mL/hr at 06/07/16 1635   PRN Meds:.sodium chloride, sodium chloride, acetaminophen, alteplase, heparin, heparin, hydrocortisone, ondansetron (ZOFRAN) IV, sodium chloride flush, sodium chloride flush, traMADol Medications Prior to Admission:  Prior to Admission medications   Medication Sig Start Date End Date Taking? Authorizing Provider  apixaban (ELIQUIS) 5 MG TABS tablet Take 5 mg by mouth 2 (two) times daily.   Yes Historical Provider, MD  ASPIRIN LOW DOSE 81 MG EC tablet Take 81 mg by mouth daily. 03/24/16  Yes Historical Provider, MD  atorvastatin (LIPITOR) 80 MG tablet Take 80 mg by mouth daily. 05/29/16  Yes Historical Provider, MD  losartan (COZAAR) 25 MG tablet Take 25 mg by mouth daily. 05/29/16  Yes Historical Provider, MD  midodrine  (PROAMATINE) 10 MG tablet Take 10 mg by mouth 3 (three) times daily.  05/29/16  Yes Historical Provider, MD  pantoprazole (PROTONIX) 40 MG tablet Take 40 mg by mouth daily. 05/29/16  Yes Historical Provider, MD  RENVELA 800 MG tablet Take 1,600 mg by mouth 3 (three) times daily. 03/24/16  Yes Historical Provider, MD   Allergies  Allergen Reactions  . Hectorol [Doxercalciferol] Other (See Comments)    unknown  . Iodinated Diagnostic Agents Other (See Comments)    unknown  . Iron Other (See Comments)    unknown  . Betadine [Povidone Iodine] Rash   Review of Systems  Constitutional: Positive for activity change.  Respiratory: Negative for shortness of breath.   Cardiovascular: Positive for leg swelling.  Neurological: Positive for weakness.    Physical Exam  Constitutional: He is oriented to person, place, and time. He appears well-developed and well-nourished.  HENT:  Head: Normocephalic and atraumatic.  Poor dentition  Cardiovascular: Normal rate.   Pulmonary/Chest: Effort normal. No accessory muscle usage. No tachypnea. No respiratory distress.  Abdominal: Soft. Normal appearance.  Neurological: He is alert and oriented to person, place, and time.    Vital Signs: BP (!) 81/57   Pulse 99   Temp 97.8 F (36.6 C) (Oral)   Resp 11   Ht _0  (1.753 m)   Wt 104 kg (229 lb 4.5 oz)   SpO2 (!) 86%   BMI 33.86 kg/m  Pain Assessment: No/denies pain POSS *See Group Information*: 1-Acceptable,Awake and alert Pain Score: 0-No pain   SpO2: SpO2: (!) 86 % O2 Device:SpO2: (!) 86 % O2 Flow Rate: .O2 Flow Rate (L/min): 5 L/min  IO: Intake/output summary:  Intake/Output Summary (Last 24 hours) at 06/07/16 1657 Last data filed at 06/07/16 1600  Gross per 24 hour  Intake                0 ml  Output             1262 ml  Net            -1262 ml    LBM: Last BM Date: 06/06/16 Baseline Weight: Weight: 121.7 kg (268 lb 4.8 oz) Most recent weight: Weight: 104 kg (229 lb 4.5 oz)       Palliative Assessment/Data: PPS: 30%   Flowsheet Rows   Flowsheet Row Most Recent Value  Intake Tab  Referral Department  Critical care  Unit at Time of Referral  ICU  Palliative Care Primary Diagnosis  Cardiac  Date Notified  06/07/16  Palliative Care Type  New Palliative care  Reason for referral  Clarify Goals of Care  Date of Admission  05/31/16  # of days IP prior to Palliative referral  7  Clinical Assessment  Psychosocial & Spiritual Assessment  Palliative Care Outcomes      Time In: 1000 Time Out: 1050 Time Total: 66mn Greater than 50%  of this time was spent counseling and coordinating care related to the above assessment and plan.  Signed by: PPershing Proud NP   Please contact Palliative Medicine Team phone at [phone] questions and concerns.  For individual provider: See AShea Evans

## 2016-06-07 NOTE — Progress Notes (Signed)
PULMONARY / CRITICAL CARE MEDICINE   Name: Reginald Fields MRN: [id number] DOB: 1961-03-11    ADMISSION DATE:  05/31/2016 CONSULTATION DATE:  05/31/2016  REFERRING MD:  [facility] MD  CHIEF COMPLAINT:  SOB  HISTORY OF PRESENT ILLNESS:   55 year old male with PMH as below, which includes known severe AS, COPD (on chronic 3L O2), atrial fibrillation, ESRD (on HD TTS), HTN, PAH, and bacteremia. He was admitted to [facility] with complaints of SOB and bilateral edema which have been progressive for 3 months. He has been more hypotensive recently which has limited HD to an extent. Also beat blockers have been stopped. He underwent HD 4/24 with removal of 1L. He also required 1 unit PRBC transfusion. HD limited by hypotension. 7/25 he was still having SOB and pain in the legs and remained hypotensive. He was transferred to ICU. SOB thought to be from systolic CHF secondary to severe AS. Patient is followed by CVTS in [place], so he has been transferred to [facility] for further evaluation.   SUBJECTIVE:   Remains on CVVH with neg 50cc/hr. Mental status is unchanged S/p cardiac cath yesterday.  VITAL SIGNS: BP (!) 78/65   Pulse 92   Temp 99.3 F (37.4 C) (Oral)   Resp 17   Ht 5\' 9"  (1.753 m)   Wt 229 lb 4.5 oz (104 kg)   SpO2 (!) 88%   BMI 33.86 kg/m   HEMODYNAMICS: CVP:  [15 mmHg-20 mmHg] 20 mmHg  VENTILATOR SETTINGS:    INTAKE / OUTPUT: I/O last 3 completed shifts: In: 170 [P.O.:120; IV Piggyback:50] Out: 2518 [Other:2518]  PHYSICAL EXAMINATION: General:  Obese male in no distress. Comfortable. Tolerating CVVH Neuro:  Alert, oriented, not in distress.  No focal deficits  HEENT:  [place]/AT, PERRL, no appreciable JVD, L IJ HD cath clean Cardiovascular:  RRR, 4/6 systolic murmur Lungs:  Clear, no wheeze or crackles.   Abdomen:  Soft, non-tender, non-distended Musculoskeletal:  No acute deformity or ROM limitation. Trace edema   Skin:  Grossly  intact  LABS:  BMET  Recent Labs Lab 06/06/16 0350 06/06/16 1528 06/07/16 0345  NA 137 137 136  136  K 4.7 5.1 5.1  5.0  CL 105 105 106  105  CO2 26 28 25  26   BUN 20 19 22*  22*  CREATININE 2.06* 2.17* 2.15*  2.17*  GLUCOSE 81 81 143*  142*    Electrolytes  Recent Labs Lab 06/05/16 0418  06/06/16 0350 06/06/16 1528 06/07/16 0345  CALCIUM 10.3  < > 9.5 9.8 9.6  9.5  MG 2.4  --  2.3  --  2.5*  PHOS 3.6  < > 3.3 3.5 3.5  < > = values in this interval not displayed.  CBC  Recent Labs Lab 06/05/16 0418 06/06/16 0350 06/07/16 0345  WBC 8.6 6.8 7.2  HGB 7.4* 7.4* 8.0*  HCT 26.8* 25.7* 27.5*  PLT 185 180 218    Coag's  Recent Labs Lab 06/02/16 1508  06/03/16 0517  06/05/16 1600 06/06/16 0350 06/07/16 0345  APTT  --   < >  --   < > 36 39* 36  INR 1.47  --  1.47  --  1.43  --   --   < > = values in this interval not displayed.  Sepsis Markers  Recent Labs Lab 05/31/16 1822  LATICACIDVEN 1.8  PROCALCITON 0.91    ABG  Recent Labs Lab 06/06/16 1655  PHART 7.366  PCO2ART 43.9  PO2ART 64.0*    Liver Enzymes  Recent Labs Lab 05/31/16 1822  06/06/16 0350 06/06/16 1528 06/07/16 0345  AST 24  --   --   --   --   ALT 11*  --   --   --   --   ALKPHOS 95  --   --   --   --   BILITOT 2.0*  --   --   --   --   ALBUMIN 2.9*  < > 2.7* 2.7* 2.7*  < > = values in this interval not displayed.  Cardiac Enzymes  Recent Labs Lab 06/03/16 1958 06/04/16 0138 06/04/16 0707  TROPONINI 0.07* 0.07* 0.08*    Glucose No results for input(s): GLUCAP in the last 168 hours.  Imaging Dg Chest Port 1 View  Result Date: 06/07/2016 CLINICAL DATA:  Respiratory failure, acute EXAM: PORTABLE CHEST 1 VIEW COMPARISON:  June 06, 2016 FINDINGS: Central catheter tip is in the left innominate vein. No pneumothorax. There remains interstitial edema bilaterally with areas of patchy alveolar edema, stable. There is cardiomegaly with pulmonary venous  hypertension. No adenopathy. There are foci of carotid artery calcification bilaterally. IMPRESSION: Stable appearing changes of congestive heart failure. No new opacity. No change in cardiac silhouette. Stable central catheter position. No pneumothorax. Foci of carotid artery calcification noted bilaterally. Electronically Signed   By: Bretta Bang III M.D.   On: 06/07/2016 07:35     STUDIES:  Echo 7/7: LVEF 50-55%, PA pressures 72-70mmHg, severe AS, moderate MS, mod/sev TR.   Cardiac cath 7/31 1. Severely calcified but patent coronary arteries 2. Known severe aortic stenosis, severe mitral stenosis, and severe tricuspid regurgitation by echo 3. Severe systemic hypotension with Central aortic pressure of 60/46 and suprasystemic pulmonary artery pressure of 68/37 with a mean of 50 4. Normal cardiac output and index by Fick calculation (5.08 and 2.32 respectively) but pulmonary artery oxygen saturation of 34% suggestive of low cardiac output (2 separate samples drawn) 5. Calcified aortoiliac vessels but no significant narrowing  CULTURES: Blood culture from perm-cath from OSH [facility] ) > 1/2 MRSA Blood 7/25 > negative MRSA 7/25 (+)  ANTIBIOTICS: Vanc 7/26 >   SIGNIFICANT EVENTS: 7/25  Transfer from [place] for CVVH. Pt with severe valvular heart dse with volume overload and CKD 5.  7/29  CVVHD continues, net neg 16.5L.  Episode chest pain overnight, resolved.  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 Heart cath results noted. > shows severe pulmonary hypertension  I don't think he will tolerate pulmonary vasodilators given his low systemic pressure and he is a very high risk surgical candidate.  It he is not a candidate for transfer to [facility] to OR then we will need to consider hospice.  I updated the patient on the situation. He is  tearful and want [place] to call his sister and mother. I will also get palliative care involved.   ASSESSMENT / PLAN:  PULMONARY A: Acute on chronic hypoxemic respiratory failure secondary to pulmonary edema/volume overload in a setting of severe AS, MS, pulm HTN COPD without acute exacerbation  P:   On going CVVH for volume removal > tolerating with BP 50-70 systolic (chronic).   Supplemental O2 for sats > 92% Scheduled and PRN nebs Pulmonary hygiene Incentive spirometry   CARDIOVASCULAR A:  Acute on chronic systolic/diastolic CHF Severe  aortic stenosis, mitral stenosis, TR pulm HTN Hypotension > chronic per pt baseline SBP in 70s. (LA, cortisol WNL)  P:  Cath results and CVTS eval noted. Pt is at high risk for surgery Cards and CVTS following Telemetry monitoring Will tolerate hypotension for now as mental status intact, consider pressors if mental status changes. Marland Kitchen   RENAL A:   ESRD on HD TTS  P:   Cont CVVH. Renal following.  Continue negative balance as tolerated, monitor closely with severe AS   GASTROINTESTINAL A:   No acute issues  P:   Renal diet with fluid restriction Protonix for SUP  HEMATOLOGIC A:   Anemia of chronic illness  P:  Follow Heparin SQ for VTE ppx  INFECTIOUS A:   MRSA bacteremia considered. (+) in 1/2 bottles (this bottle from permcath) from OSH allegedly. [facility] hospital on 7/27 and so far, all blood cultures have been (-).   P:   Perm cath was dc'd on 7/26 2/2 suspected line infection.  Cont vanc for now until official report from [place] is in > as of 7/27, all blood cultures from [place] were negative.    ENDOCRINE A:   No acute issues   P:   Follow glucose on BMP  NEUROLOGIC A:   No acute issues  P:   Monitor / supportive care  FAMILY  - Updates: Patient updated bedside 7/30 - Inter-disciplinary family meet or Palliative Care meeting due by:  8/1  Critical care time- 35 mins.  Chilton Greathouse  MD [facility] Pager [phone] If no answer or after 3pm call: [phone] 06/06/2016, 4:34 AM

## 2016-06-07 NOTE — Progress Notes (Signed)
[facility] Progress Note   Subjective: s/p cardiac cath- calcified but patent coronaries.  CTS on board- very high risk for procedure but have not completely ruled it out.  BP 70's and 80's today but CVP up to 20 ? EDP also 20 but did not comment on high volume - with CRRT another 1200 removed   Vitals:   06/07/16 0500 06/07/16 0600 06/07/16 0700 06/07/16 0800  BP: (!) 78/60 (!) 79/55 (!) 88/70 (!) 86/57  Pulse: 95 97 97 96  Resp: 18 (!) 24 20 (!) 22  Temp:      TempSrc:      SpO2: (!) 84% (!) 83% (!) 81% (!) 85%  Weight:      Height:        Inpatient medications: . antiseptic oral rinse  7 mL Mouth Rinse BID  . arformoterol  15 mcg Nebulization BID  . budesonide (PULMICORT) nebulizer solution  0.5 mg Nebulization BID  . darbepoetin (ARANESP) injection - DIALYSIS  200 mcg Intravenous Q Sat-HD  . heparin subcutaneous  5,000 Units Subcutaneous Q8H  . pantoprazole  40 mg Oral QHS  . sodium chloride flush  10-40 mL Intracatheter Q12H  . sodium chloride flush  3 mL Intravenous Q12H  . vancomycin  1,250 mg Intravenous Q48H   . dialysis replacement fluid (prismasate) 400 mL/hr at 06/06/16 2234  . dialysis replacement fluid (prismasate) 200 mL/hr at 06/06/16 0324  . dialysate (PRISMASATE) 2,000 mL/hr at 06/07/16 0981   sodium chloride, sodium chloride, acetaminophen, alteplase, heparin, heparin, hydrocortisone, ondansetron (ZOFRAN) IV, sodium chloride flush, sodium chloride flush, traMADol  Exam: Gen : obesity, chron ill-appearing, angry Chest clear bilat, no rales or wheezing RRR loud 3/6 SEM throughout the upper chest, no RG Abd obese, nontender +bs Ext 1-+ bilat LE edema Neuro is alert, Ox 3 , nf  CXR 7/26 - bilat diffuse pulm edema, moderate severity CXR 7/27 - slight improvement in CHF CXR 7/28 - pending  HD [place]   TTS  5h  400/1.5   106kg  2/2.5 bath   Heparin 7000 w mid Rx 3000 Getting Mircera 200 ug due on Sat 7/29 Getting loading dose of  iron   Assessment: 1  Severe AS - s/p heart cath  2  Pulm edema/severe vol overload - decreasing vol w CRRT- CVP 20 now- not sure what goal cvp should be for him- still with peripheral edema but likely third spacing some- continue UF to 50 per hour  3  Hypotension, looks chronic/ subacute at min 4  ESRD TTS hd, on HD 6 yrs. Has temp cath in now for HD- most likely could not support an AV access with that BP 5  MRSA +blood cx from outside hosp- tunneled cath removed 7/26 6  Hist afib, in NSR here   7  Anemia of CKD, cont max ESA here; tsat 14%, but has iron "allergy", will consider alternative IV iron.  Consider transfusion 8. Dispo- pt seems very agitated with everything- I attempted to tell him that there is likely much more to come.   I also fear that even after this Herculean task is complete he will still not tolerate regular HD and continue to have the same issues.  Will continue with CRRT for now to tune up for possible surgery as  If we stop it, fluid will start to accumulate again       Miran Kautzman A  06/07/2016, 8:23 AM    Recent Labs Lab 06/06/16 0350 06/06/16 1528  06/07/16 0345  NA 137 137 136  136  K 4.7 5.1 5.1  5.0  CL 105 105 106  105  CO2 26 28 25  26   GLUCOSE 81 81 143*  142*  BUN 20 19 22*  22*  CREATININE 2.06* 2.17* 2.15*  2.17*  CALCIUM 9.5 9.8 9.6  9.5  PHOS 3.3 3.5 3.5    Recent Labs Lab 05/31/16 1822  06/06/16 0350 06/06/16 1528 06/07/16 0345  AST 24  --   --   --   --   ALT 11*  --   --   --   --   ALKPHOS 95  --   --   --   --   BILITOT 2.0*  --   --   --   --   PROT 6.3*  --   --   --   --   ALBUMIN 2.9*  < > 2.7* 2.7* 2.7*  < > = values in this interval not displayed.  Recent Labs Lab 05/31/16 1822  06/05/16 0418 06/06/16 0350 06/07/16 0345  WBC 8.3  < > 8.6 6.8 7.2  NEUTROABS 6.9  --   --   --   --   HGB 8.1*  < > 7.4* 7.4* 8.0*  HCT 27.9*  < > 26.8* 25.7* 27.5*  MCV 103.0*  < > 106.8* 104.5* 103.8*  PLT 185   < > 185 180 218  < > = values in this interval not displayed. Iron/TIBC/Ferritin/ %Sat    Component Value Date/Time   IRON 40 (L) 06/03/2016 1228   TIBC 293 06/03/2016 1228   IRONPCTSAT 14 (L) 06/03/2016 1228

## 2016-06-08 DIAGNOSIS — Z992 Dependence on renal dialysis: Secondary | ICD-10-CM

## 2016-06-08 DIAGNOSIS — N186 End stage renal disease: Secondary | ICD-10-CM

## 2016-06-08 DIAGNOSIS — Z515 Encounter for palliative care: Secondary | ICD-10-CM

## 2016-06-08 DIAGNOSIS — I5043 Acute on chronic combined systolic (congestive) and diastolic (congestive) heart failure: Secondary | ICD-10-CM

## 2016-06-08 DIAGNOSIS — Z66 Do not resuscitate: Secondary | ICD-10-CM

## 2016-06-08 LAB — APTT: aPTT: 36 seconds (ref 24–36)

## 2016-06-08 LAB — CBC
HCT: 25.9 % — ABNORMAL LOW (ref 39.0–52.0)
HEMOGLOBIN: 7.3 g/dL — AB (ref 13.0–17.0)
MCH: 29.8 pg (ref 26.0–34.0)
MCHC: 28.2 g/dL — ABNORMAL LOW (ref 30.0–36.0)
MCV: 105.7 fL — ABNORMAL HIGH (ref 78.0–100.0)
PLATELETS: 193 10*3/uL (ref 150–400)
RBC: 2.45 MIL/uL — AB (ref 4.22–5.81)
RDW: 18.5 % — ABNORMAL HIGH (ref 11.5–15.5)
WBC: 8.1 10*3/uL (ref 4.0–10.5)

## 2016-06-08 LAB — RENAL FUNCTION PANEL
ALBUMIN: 2.7 g/dL — AB (ref 3.5–5.0)
ALBUMIN: 2.6 g/dL — AB (ref 3.5–5.0)
ANION GAP: 3 — AB (ref 5–15)
ANION GAP: 3 — AB (ref 5–15)
BUN: 20 mg/dL (ref 6–20)
BUN: 20 mg/dL (ref 6–20)
CALCIUM: 10 mg/dL (ref 8.9–10.3)
CHLORIDE: 104 mmol/L (ref 101–111)
CO2: 29 mmol/L (ref 22–32)
CO2: 28 mmol/L (ref 22–32)
CREATININE: 1.9 mg/dL — AB (ref 0.61–1.24)
Calcium: 10.3 mg/dL (ref 8.9–10.3)
Chloride: 107 mmol/L (ref 101–111)
Creatinine, Ser: 2.2 mg/dL — ABNORMAL HIGH (ref 0.61–1.24)
GFR calc Af Amer: 38 mL/min — ABNORMAL LOW (ref >60)
GFR, EST AFRICAN AMERICAN: 45 mL/min — AB (ref >60)
GFR, EST NON AFRICAN AMERICAN: 32 mL/min — AB (ref >60)
GFR, EST NON AFRICAN AMERICAN: 39 mL/min — AB (ref >60)
Glucose, Bld: 86 mg/dL (ref 65–99)
Glucose, Bld: 103 mg/dL — ABNORMAL HIGH (ref 65–99)
PHOSPHORUS: 3.7 mg/dL (ref 2.5–4.6)
PHOSPHORUS: 3 mg/dL (ref 2.5–4.6)
POTASSIUM: 4.7 mmol/L (ref 3.5–5.1)
Potassium: 4.3 mmol/L (ref 3.5–5.1)
SODIUM: 138 mmol/L (ref 135–145)
Sodium: 136 mmol/L (ref 135–145)

## 2016-06-08 LAB — MAGNESIUM: MAGNESIUM: 2.4 mg/dL (ref 1.7–2.4)

## 2016-06-08 MED ORDER — OXYCODONE HCL 5 MG/5ML PO SOLN: 5.0000 mg | ORAL | Status: DC | PRN

## 2016-06-08 NOTE — Progress Notes (Signed)
See plans now for hospice care- I have told the nurse to stop CRRT if the machine clots or if patient requests- they can stop it at any time.  Renal will sign off  Deneise Getty A

## 2016-06-08 NOTE — Progress Notes (Signed)
[facility] Progress Note   Subjective: s/p cardiac cath- calcified but patent coronaries.  CTS on board- very high risk for procedure but have not completely ruled it out.  BP 60's to 70's  today - CVP variable- - with CRRT another 1600 removed   Vitals:   06/08/16 0600 06/08/16 0700 06/08/16 0800 06/08/16 0821  BP: (!) 65/51 (!) 76/51 (!) 70/53   Pulse:      Resp: 19 20 15    Temp:   97.6 F (36.4 C)   TempSrc:   Oral   SpO2:    97%  Weight: 104.2 kg (229 lb 11.5 oz)     Height:        Inpatient medications: . antiseptic oral rinse  7 mL Mouth Rinse BID  . arformoterol  15 mcg Nebulization BID  . budesonide (PULMICORT) nebulizer solution  0.5 mg Nebulization BID  . darbepoetin (ARANESP) injection - DIALYSIS  200 mcg Intravenous Q Sat-HD  . heparin subcutaneous  5,000 Units Subcutaneous Q8H  . pantoprazole  40 mg Oral QHS  . sodium chloride flush  10-40 mL Intracatheter Q12H  . vancomycin  1,250 mg Intravenous Q48H   . dialysis replacement fluid (prismasate) 400 mL/hr at 06/08/16 0111  . dialysis replacement fluid (prismasate) 200 mL/hr at 06/07/16 1149  . dialysate (PRISMASATE) 2,000 mL/hr at 06/08/16 0845   sodium chloride, sodium chloride, acetaminophen, alteplase, heparin, heparin, hydrocortisone, ondansetron (ZOFRAN) IV, sodium chloride flush, traMADol  Exam: Gen : obesity, chron ill-appearing, angry Chest clear bilat, no rales or wheezing RRR loud 3/6 SEM throughout the upper chest, no RG Abd obese, nontender +bs Ext 1-+ bilat LE edema Neuro is alert, Ox 3 , nf  CXR 7/26 - bilat diffuse pulm edema, moderate severity CXR 7/27 - slight improvement in CHF CXR 7/28 - pending  HD [place]   TTS  5h  400/1.5   106kg  2/2.5 bath   Heparin 7000 w mid Rx 3000 Getting Mircera 200 ug due on Sat 7/29 Getting loading dose of iron   Assessment: 1  Severe AS - s/p heart cath - considering surgery but at high risk- possible transfer to [facility] where ecmo  support vs palliative care- discussions in process 2  Pulm edema/severe vol overload - decreasing vol w CRRT- - not sure what goal cvp should be for him- still with peripheral edema but likely third spacing some- continue UF to 50 per hour  3  Hypotension, looks chronic/ subacute at min 4  ESRD TTS hd, on HD 6 yrs. Has temp cath in now (placed 7/26)for HD after PC removed for infection- most likely could not support an AV access with that BP 5  MRSA +blood cx from outside hosp- tunneled cath removed 7/26 6  Hist afib, in NSR here   7  Anemia of CKD, cont max ESA here; tsat 14%, but has iron "allergy", will consider alternative IV iron.  Consider transfusion- hgb 7.3 8. Dispo-   I also fear that even after this Herculean task is complete he will still not tolerate regular HD Will continue with CRRT for now to tune up for possible surgery as  If we stop it, fluid will start to accumulate again       Reginald Fields A  06/08/2016, 8:57 AM    Recent Labs Lab 06/07/16 0345 06/07/16 1700 06/08/16 0500  NA 136  136 139 136  K 5.1  5.0 4.8 4.7  CL 106  105 106 104  CO2 25  GLUCOSE 143*  142* 100* 86  BUN 22*  22* 21* 20  CREATININE 2.15*  2.17* 2.06* 2.20*  CALCIUM 9.6  9.5 10.4* 10.3  PHOS 3.5 3.7 3.7    Recent Labs Lab 06/07/16 0345 06/07/16 1700 06/08/16 0500  ALBUMIN 2.7* 2.7* 2.7*    Recent Labs Lab 06/06/16 0350 06/07/16 0345 06/08/16 0419  WBC 6.8 7.2 8.1  HGB 7.4* 8.0* 7.3*  HCT 25.7* 27.5* 25.9*  MCV 104.5* 103.8* 105.7*  PLT 180 218 193   Iron/TIBC/Ferritin/ %Sat    Component Value Date/Time   IRON 40 (L) 06/03/2016 1228   TIBC 293 06/03/2016 1228   IRONPCTSAT 14 (L) 06/03/2016 1228

## 2016-06-08 NOTE — Progress Notes (Signed)
CRRT filter beginning to clot.  Per MD [place]'s note, if filter clots stop CRRT.  RN returning blood to patient and stopping CRRT at this time.

## 2016-06-08 NOTE — Progress Notes (Signed)
Discussed patient's case at length with structural heart team at [facility]. Considering his severe comorbidities, chronic hypotension, ESRD, and right heart failure.  He doesn't have any reasonable treatment options. I agree with Dr Cornelius Moras and others that palliative medical therapy and hospice is the best approach. Pt is looking forward to going home to be with his family.   Reginald Fields 06/08/2016 6:12 PM

## 2016-06-08 NOTE — Progress Notes (Addendum)
paliative care recommends home w hospice in [place]. Went over lists of hospice agencies in [place] and pt/mother chose [facility]. Spoke w sandy at [facility] and faxed referral. Reginald Fields states will need hosp bed, bsc, w/c, walker,oxygen,overbed table. Left out of facility form on shadow chart for md to sign.  [facility] ph [phone] and fax [phone]

## 2016-06-08 NOTE — Code Documentation (Signed)
  Interdisciplinary Goals of Care Family Meeting   Date carried out:: 05/19/2016  Location of the meeting: Bedside  Member's involved: Physician, Bedside Registered Nurse, Family Member or next of kin and Palliative care team member  Durable Power of Attorney or acting medical decision maker: Patient, mother, sister    Discussion: We discussed goals of care for [name].  I had discussed the case with Dr. Excell Seltzer, cardiology and Dr. Barry Dienes, CVTS.  I explained to patient and family that there were no surgical options since [facility] has declined to take his case. He states that he just want to go home. We have decided to change his code status to DNR. Palliative care is going to work to getting him to hospice.  Code status: Full DNR  Disposition: Home with Hospice  Time spent for the meeting: 15  Reginald Fields 10:54 AM

## 2016-06-08 NOTE — Progress Notes (Signed)
PULMONARY / CRITICAL CARE MEDICINE   Name: Reginald Fields MRN: [id number] DOB: [birth year]    ADMISSION DATE:  05/31/2016 CONSULTATION DATE:  05/31/2016  REFERRING MD:  [facility] MD  CHIEF COMPLAINT:  SOB  HISTORY OF PRESENT ILLNESS:   55 year old male with PMH as below, which includes known severe AS, COPD (on chronic 3L O2), atrial fibrillation, ESRD (on HD TTS), HTN, PAH, and bacteremia. He was admitted to [facility] with complaints of SOB and bilateral edema which have been progressive for 3 months. He has been more hypotensive recently which has limited HD to an extent. Also beat blockers have been stopped. He underwent HD 4/24 with removal of 1L. He also required 1 unit PRBC transfusion. HD limited by hypotension. 7/25 he was still having SOB and pain in the legs and remained hypotensive. He was transferred to ICU. SOB thought to be from systolic CHF secondary to severe AS. Patient is followed by CVTS in [place], so he has been transferred to [facility] for further evaluation.   SUBJECTIVE:   Remains on CVVH with neg 50cc/hr. Mental status is unchanged  VITAL SIGNS: BP (!) 60/49   Pulse (!) 104   Temp 97.6 F (36.4 C) (Oral)   Resp (!) 32   Ht 5\' 9"  (1.753 m)   Wt 229 lb 11.5 oz (104.2 kg)   SpO2 97%   BMI 33.92 kg/m   HEMODYNAMICS: CVP:  [15 mmHg-21 mmHg] 15 mmHg  VENTILATOR SETTINGS:    INTAKE / OUTPUT: I/O last 3 completed shifts: In: 21 [P.O.:75] Out: 2374 [Other:2374]  PHYSICAL EXAMINATION: General:  Obese male in no distress. Comfortable. Tolerating CVVH Neuro:  Alert, oriented, not in distress.  No focal deficits  HEENT:  [place]/AT, PERRL, no appreciable JVD, L IJ HD cath clean Cardiovascular:  RRR, 4/6 systolic murmur Lungs:  Clear, no wheeze or crackles.   Abdomen:  Soft, non-tender, non-distended Musculoskeletal:  No acute deformity or ROM limitation. Trace edema   Skin:  Grossly intact  LABS:  BMET  Recent Labs Lab 06/07/16 0345  06/07/16 1700 06/08/16 0500  NA 136  136 139 136  K 5.1  5.0 4.8 4.7  CL 106  105 106 104  CO2 25  26 28 29   BUN 22*  22* 21* 20  CREATININE 2.15*  2.17* 2.06* 2.20*  GLUCOSE 143*  142* 100* 86    Electrolytes  Recent Labs Lab 06/06/16 0350  06/07/16 0345 06/07/16 1700 06/08/16 0419 06/08/16 0500  CALCIUM 9.5  < > 9.6  9.5 10.4*  --  10.3  MG 2.3  --  2.5*  --  2.4  --   PHOS 3.3  < > 3.5 3.7  --  3.7  < > = values in this interval not displayed.  CBC  Recent Labs Lab 06/06/16 0350 06/07/16 0345 06/08/16 0419  WBC 6.8 7.2 8.1  HGB 7.4* 8.0* 7.3*  HCT 25.7* 27.5* 25.9*  PLT 180 218 193    Coag's  Recent Labs Lab 06/02/16 1508  06/03/16 0517  06/05/16 1600 06/06/16 0350 06/07/16 0345 06/08/16 0419  APTT  --   < >  --   < > 36 39* 36 36  INR 1.47  --  1.47  --  1.43  --   --   --   < > = values in this interval not displayed.  Sepsis Markers No results for input(s): LATICACIDVEN, PROCALCITON, O2SATVEN in the last 168 hours.  ABG  Recent  Labs Lab 06/06/16 1655  PHART 7.366  PCO2ART 43.9  PO2ART 64.0*    Liver Enzymes  Recent Labs Lab 06/07/16 0345 06/07/16 1700 06/08/16 0500  ALBUMIN 2.7* 2.7* 2.7*    Cardiac Enzymes  Recent Labs Lab 06/03/16 1958 06/04/16 0138 06/04/16 0707  TROPONINI 0.07* 0.07* 0.08*    Glucose No results for input(s): GLUCAP in the last 168 hours.  Imaging No results found.   STUDIES:  Echo 7/7: LVEF 50-55%, PA pressures 72-66mmHg, severe AS, moderate MS, mod/sev TR.   Cardiac cath 7/31 1. Severely calcified but patent coronary arteries 2. Known severe aortic stenosis, severe mitral stenosis, and severe tricuspid regurgitation by echo 3. Severe systemic hypotension with Central aortic pressure of 60/46 and suprasystemic pulmonary artery pressure of 68/37 with a mean of 50 4. Normal cardiac output and index by Fick calculation (5.08 and 2.32 respectively) but pulmonary artery oxygen saturation  of 34% suggestive of low cardiac output (2 separate samples drawn) 5. Calcified aortoiliac vessels but no significant narrowing  CULTURES: Blood culture from perm-cath from OSH [facility] ) > 1/2 MRSA Blood 7/25 > negative MRSA 7/25 (+)  ANTIBIOTICS: Vanc 7/26 >   SIGNIFICANT EVENTS: 7/25  Transfer from [place] for CVVH. Pt with severe valvular heart dse with volume overload and CKD 5.  7/29  CVVHD continues, net neg 16.5L.  Episode chest pain overnight, resolved.  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 Heart cath results noted. > shows severe pulmonary hypertension, with advanced valvular disease.  Discussed case with Cardiology and CVTS, pt is not a candidate for any surgical intervention here or in [facility]. Pt just wants to go home. We have decided to get palliative care involved for hospice arrangement.   ASSESSMENT / PLAN:  PULMONARY A: Acute on chronic hypoxemic respiratory failure secondary to pulmonary edema/volume overload in a setting of severe AS, MS, pulm HTN COPD without acute exacerbation  P:   On going CVVH for volume removal > tolerating with BP 50-70 systolic (chronic).   Supplemental O2 for sats > 92% Scheduled and PRN nebs Pulmonary hygiene Incentive spirometry   CARDIOVASCULAR A:  Acute on chronic systolic/diastolic CHF Severe aortic stenosis, mitral stenosis, TR pulm HTN Hypotension > chronic per pt baseline SBP in 70s. (LA, cortisol WNL)  P:  Cath results and CVTS eval noted. Pt is at high risk for surgery Cards and CVTS following Telemetry monitoring Will tolerate hypotension for now as mental status intact, consider pressors if mental status changes. Marland Kitchen   RENAL A:   ESRD on HD TTS  P:   Cont CVVH. Renal following.  Continue negative balance as tolerated, monitor closely with severe AS    GASTROINTESTINAL A:   No acute issues  P:   Renal diet with fluid restriction Protonix for SUP  HEMATOLOGIC A:   Anemia of chronic illness  P:  Follow Heparin SQ for VTE ppx  INFECTIOUS A:   MRSA bacteremia considered. (+) in 1/2 bottles (this bottle from permcath) from OSH allegedly. [facility] hospital on 7/27 and so far, all blood cultures have been (-).   P:   Perm cath was dc'd on 7/26 2/2 suspected line infection.  Cont vanc for now until official report from [place] is in > as of 7/27, all blood cultures from [place] were negative.    ENDOCRINE A:  No acute issues   P:   Follow glucose on BMP  NEUROLOGIC A:   No acute issues  P:   Monitor / supportive care  FAMILY  - Updates: Patient updated bedside 7/30 - Inter-disciplinary family meet or Palliative Care meeting due by:  8/1  Critical care time- 35 mins.  Chilton Greathouse MD [facility] Pager [phone] If no answer or after 3pm call: [phone] 06/06/2016, 4:34 AM

## 2016-06-08 NOTE — Progress Notes (Signed)
TCTS BRIEF PROGRESS NOTE   I agree w/ plans for d/c home on Hospice care.  After discussing case at length with multiple members of our multidisciplinary team and over the telephone with colleagues at [facility], we all feel that the patient's RV dysfunction appears prohibitive and that surgery is not a reasonable option at this time.  Purcell Nails, MD 06/08/2016 5:35 PM

## 2016-06-09 MED ORDER — ALPRAZOLAM 0.25 MG PO TABS: 0.2500 mg | ORAL_TABLET | Freq: Three times a day (TID) | ORAL | Status: DC | PRN

## 2016-06-09 MED ORDER — OXYCODONE HCL 5 MG/5ML PO SOLN: 5.0000 mg | ORAL | 0 refills | Status: AC | PRN

## 2016-06-09 MED ORDER — ALPRAZOLAM 0.25 MG PO TABS: 0.2500 mg | ORAL_TABLET | Freq: Three times a day (TID) | ORAL | 0 refills | Status: AC | PRN

## 2016-06-09 MED ORDER — OXYCODONE HCL 5 MG/5ML PO SOLN: 5.0000 mg | ORAL | Status: DC | PRN

## 2016-06-09 NOTE — Progress Notes (Addendum)
Spoke w Johnston Ebbs at [employer] [facility] [employer]. She has ordered equipment. They would like pt home no later than 2pm. Out of facility dnr signed. Await dc order. Pt anxious. He wants to get home for home cooking. Dc order in and dc summary and meds faxed [facility]. Spoke w ns Johnston Ebbs  [phone]. Eq to be del by 1 pm and amb sched for 1 pm if eq in home.

## 2016-06-09 NOTE — Progress Notes (Signed)
Palliative:  Mr. Byrdsong says he feels good today and is very much looking forward to going home. He has no complaints. I did add Oxyfast and Xanax prn for symptoms if needed at home. Emotional support provided. Awaiting equipment from hospice and [facility] to arrange transportation home.   No Charge  Yong Channel, NP Palliative Medicine Team Pager # [phone] (M-F 8a-5p) Team Phone # [phone] (Nights/Weekends)

## 2016-06-09 NOTE — Progress Notes (Signed)
PULMONARY / CRITICAL CARE MEDICINE   Name: Reginald Fields MRN: [id number] DOB: 15-Jan-1961    ADMISSION DATE:  05/31/2016 CONSULTATION DATE:  05/31/2016  REFERRING MD:  [facility] MD  CHIEF COMPLAINT:  SOB  HISTORY OF PRESENT ILLNESS:   55 year old male with PMH as below, which includes known severe AS, COPD (on chronic 3L O2), atrial fibrillation, ESRD (on HD TTS), HTN, PAH, and bacteremia. He was admitted to [facility] with complaints of SOB and bilateral edema which have been progressive for 3 months. He has been more hypotensive recently which has limited HD to an extent. Also beat blockers have been stopped. He underwent HD 4/24 with removal of 1L. He also required 1 unit PRBC transfusion. HD limited by hypotension. 7/25 he was still having SOB and pain in the legs and remained hypotensive. He was transferred to ICU. SOB thought to be from systolic CHF secondary to severe AS. Patient is followed by CVTS in [place], so he has been transferred to [facility] for further evaluation.   SUBJECTIVE:   No events overnight.   VITAL SIGNS: BP (!) 65/48 (BP Location: Left Arm)   Pulse (!) 105   Temp 98.1 F (36.7 C) (Oral)   Resp (!) 28   Ht 5\' 9"  (1.753 m)   Wt 229 lb 11.5 oz (104.2 kg)   SpO2 100%   BMI 33.92 kg/m   HEMODYNAMICS: CVP:  [16 mmHg-20 mmHg] 20 mmHg  VENTILATOR SETTINGS:    INTAKE / OUTPUT: I/O last 3 completed shifts: In: 1155 [P.O.:1155] Out: 1858 [Other:1858]  PHYSICAL EXAMINATION: General:  Obese male in no distress. Comfortable. Tolerating CVVH Neuro:  Alert, oriented, not in distress.  No focal deficits  HEENT:  [place]/AT, PERRL, no appreciable JVD, L IJ HD cath clean Cardiovascular:  RRR, 4/6 systolic murmur Lungs:  Clear, no wheeze or crackles.   Abdomen:  Soft, non-tender, non-distended Musculoskeletal:  No acute deformity or ROM limitation. Trace edema   Skin:  Grossly intact  LABS:  BMET  Recent Labs Lab 06/07/16 1700  06/08/16 0500 06/08/16 1650  NA 139 136 138  K 4.8 4.7 4.3  CL 106 104 107  CO2 28 29 28   BUN 21* 20 20  CREATININE 2.06* 2.20* 1.90*  GLUCOSE 100* 86 103*    Electrolytes  Recent Labs Lab 06/06/16 0350  06/07/16 0345 06/07/16 1700 06/08/16 0419 06/08/16 0500 06/08/16 1650  CALCIUM 9.5  < > 9.6  9.5 10.4*  --  10.3 10.0  MG 2.3  --  2.5*  --  2.4  --   --   PHOS 3.3  < > 3.5 3.7  --  3.7 3.0  < > = values in this interval not displayed.  CBC  Recent Labs Lab 06/06/16 0350 06/07/16 0345 06/08/16 0419  WBC 6.8 7.2 8.1  HGB 7.4* 8.0* 7.3*  HCT 25.7* 27.5* 25.9*  PLT 180 218 193    Coag's  Recent Labs Lab 06/02/16 1508  06/03/16 0517  06/05/16 1600 06/06/16 0350 06/07/16 0345 06/08/16 0419  APTT  --   < >  --   < > 36 39* 36 36  INR 1.47  --  1.47  --  1.43  --   --   --   < > = values in this interval not displayed.  Sepsis Markers No results for input(s): LATICACIDVEN, PROCALCITON, O2SATVEN in the last 168 hours.  ABG  Recent Labs Lab 06/06/16 1655  PHART 7.366  PCO2ART  43.9  PO2ART 64.0*    Liver Enzymes  Recent Labs Lab 06/07/16 1700 06/08/16 0500 06/08/16 1650  ALBUMIN 2.7* 2.7* 2.6*    Cardiac Enzymes  Recent Labs Lab 06/03/16 1958 06/04/16 0138 06/04/16 0707  TROPONINI 0.07* 0.07* 0.08*    Glucose No results for input(s): GLUCAP in the last 168 hours.  Imaging No results found.   STUDIES:  Echo 7/7: LVEF 50-55%, PA pressures 72-44mmHg, severe AS, moderate MS, mod/sev TR.   Cardiac cath 7/31 1. Severely calcified but patent coronary arteries 2. Known severe aortic stenosis, severe mitral stenosis, and severe tricuspid regurgitation by echo 3. Severe systemic hypotension with Central aortic pressure of 60/46 and suprasystemic pulmonary artery pressure of 68/37 with a mean of 50 4. Normal cardiac output and index by Fick calculation (5.08 and 2.32 respectively) but pulmonary artery oxygen saturation of 34%  suggestive of low cardiac output (2 separate samples drawn) 5. Calcified aortoiliac vessels but no significant narrowing  CULTURES: Blood culture from perm-cath from OSH [facility] ) > 1/2 MRSA Blood 7/25 > negative MRSA 7/25 (+)  ANTIBIOTICS: Vanc 7/26 >   SIGNIFICANT EVENTS: 7/25  Transfer from [place] for CVVH. Pt with severe valvular heart dse with volume overload and CKD 5.  7/29  CVVHD continues, net neg 16.5L.  Episode chest pain overnight, resolved.  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 Heart cath results noted. > shows severe pulmonary hypertension, with advanced valvular disease.  Discussed case with Cardiology and CVTS, pt is not a candidate for any surgical intervention here or in [facility]. Pt just wants to go home.  Palliative care is involved and pt is due to go home with hospice today. Will D/C all active orders.  Chilton Greathouse MD [facility] Pager [phone] If no answer or after 3pm call: [phone] 06/09/2016, 10:38 AM

## 2016-06-09 NOTE — Care Management Important Message (Signed)
Important Message  Patient Details  Name: Reginald Fields MRN: [id number] Date of Birth: 1960-11-11   Medicare Important Message Given:  Yes    Hanley Hays, RN 06/09/2016, 9:46 AM

## 2016-06-09 NOTE — Discharge Summary (Signed)
Physician Discharge Summary  Patient ID: Reginald Fields MRN: [id number] DOB/AGE: [birth year] 55 y.o.  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 He was initially admitted to [facility] with complaints of SOB and bilateral edema which have been progressive for 3 months. He has been more hypotensive recently which has limited HD to an extent. SOB thought to be from systolic CHF secondary to severe AS. Patient is followed by CVTS in [place], so he has been transferred 7/25 to [facility] for further evaluation.  He was started on CRRT with some success at volume removal.  He was seen in consultation by CVTS and deemed not a surgical candidate.  He met with palliative care and made clear decisions that he wanted to discontinue dialysis and be at home with his family with home hospice support.     STUDIES:  Echo 7/7: LVEF 50-55%, PA pressures 72-84mHg, severe AS, moderate MS, mod/sev TR.  Cardiac cath 7/31 1. Severely calcified but patent coronary arteries 2. Known severe  aortic stenosis, severe mitral stenosis, and severe tricuspid regurgitation by echo 3. Severe systemic hypotension with Central aortic pressure of 60/46 and suprasystemic pulmonary artery pressure of 68/37 with a mean of 50 4. Normal cardiac output and index by Fick calculation (5.08 and 2.32 respectively) but pulmonary artery oxygen saturation of 34% suggestive of low cardiac output (2 separate samples drawn) 5. Calcified aortoiliac vessels but no significant narrowing  CULTURES: Blood culture from perm-cath from OSH ([facility]) > 1/2 MRSA Blood 7/25 > negative MRSA 7/25 (+)  ANTIBIOTICS: Vanc 7/26 > 8/3  SIGNIFICANT EVENTS: 7/25  Transfer from [place] for CVVH. Pt with severe valvular heart dse with volume overload and CKD 5.  7/29  CVVHD continues, net neg 16.5L.  Episode chest pain overnight, resolved.  LINES/TUBES: RIJ perm cath >> dc'd on 7/26 L IJ HD 7/26    Vitals:   06/09/16 0317 06/09/16 0323 06/09/16 0405 06/09/16 0812  BP: (!) 63/48  (!) 87/72 (!) 65/48  Pulse:    (!) 105  Resp: 19  (!) 27 (!) 28  Temp:  98.1 F (36.7 C)  98.1 F (36.7 C)  TempSrc:  Oral  Oral  SpO2:   100% 100%  Weight:   104.2 kg (229 lb  11.5 oz)   Height:   5' 9"  (1.753 m)      Discharge Labs  BMET  Recent Labs Lab 06/04/16 0138  06/05/16 0418  06/06/16 0350 06/06/16 1528 06/07/16 0345 06/07/16 1700 06/08/16 0419 06/08/16 0500 06/08/16 1650  NA  --   < > 136  < > 137 137 136  136 139  --  136 138  K  --   < > 4.2  < > 4.7 5.1 5.1  5.0 4.8  --  4.7 4.3  CL  --   < > 103  < > 105 105 106  105 106  --  104 107  CO2  --   < > 27  < > 26 28 25  26 28   --  29 28  GLUCOSE  --   < > 97  < > 81 81 143*  142* 100*  --  86 103*  BUN  --   < > 21*  < > 20 19 22*  22* 21*  --  20 20  CREATININE  --   < > 2.27*  < > 2.06* 2.17* 2.15*  2.17* 2.06*  --  2.20* 1.90*  CALCIUM  --   < > 10.3  < > 9.5 9.8 9.6  9.5 10.4*  --  10.3 10.0  MG 2.5*  --  2.4  --  2.3  --  2.5*  --  2.4   --   --   PHOS  --   < > 3.6  < > 3.3 3.5 3.5 3.7  --  3.7 3.0  < > = values in this interval not displayed.   CBC   Recent Labs Lab 06/06/16 0350 06/07/16 0345 06/08/16 0419  HGB 7.4* 8.0* 7.3*  HCT 25.7* 27.5* 25.9*  WBC 6.8 7.2 8.1  PLT 180 218 193   Anti-Coagulation  Recent Labs Lab 06/02/16 1508 06/03/16 0517 06/05/16 1600  INR 1.47 1.47 1.43           Medication List    STOP taking these medications   apixaban 5 MG Tabs tablet Commonly known as:  ELIQUIS   ASPIRIN LOW DOSE 81 MG EC tablet Generic drug:  aspirin   atorvastatin 80 MG tablet Commonly known as:  LIPITOR   losartan 25 MG tablet Commonly known as:  COZAAR   midodrine 10 MG tablet Commonly known as:  PROAMATINE   pantoprazole 40 MG tablet Commonly known as:  PROTONIX   RENVELA 800 MG tablet Generic drug:  sevelamer carbonate     TAKE these medications   ALPRAZolam 0.25 MG tablet Commonly known as:  XANAX Take 1 tablet (0.25 mg total) by mouth 3 (three) times daily as needed for anxiety or sleep.   oxyCODONE 5 MG/5ML solution Commonly known as:  ROXICODONE Take 5-10 mLs (5-10 mg total) by mouth every 2 (two) hours as needed for severe pain (dyspnea).         Disposition: Home with hospice   Discharged Condition: Reginald Fields has met maximum benefit of inpatient care and is medically stable and cleared for discharge.  Patient is pending follow up as above.      Time spent on disposition:  Greater than 35 minutes.   SignedNickolas Madrid, NP 06/09/2016  11:27 AM Pager: [phone] or [phone]

## 2016-07-08 DEATH — deceased

## 2017-09-17 IMAGING — CR DG CHEST 1V PORT
1 series · 1 of 1 positions shown · non-contrast
Comparison: No recent prior.

CLINICAL DATA: Shortness of breath.

EXAM:
PORTABLE CHEST 1 VIEW

[AP]
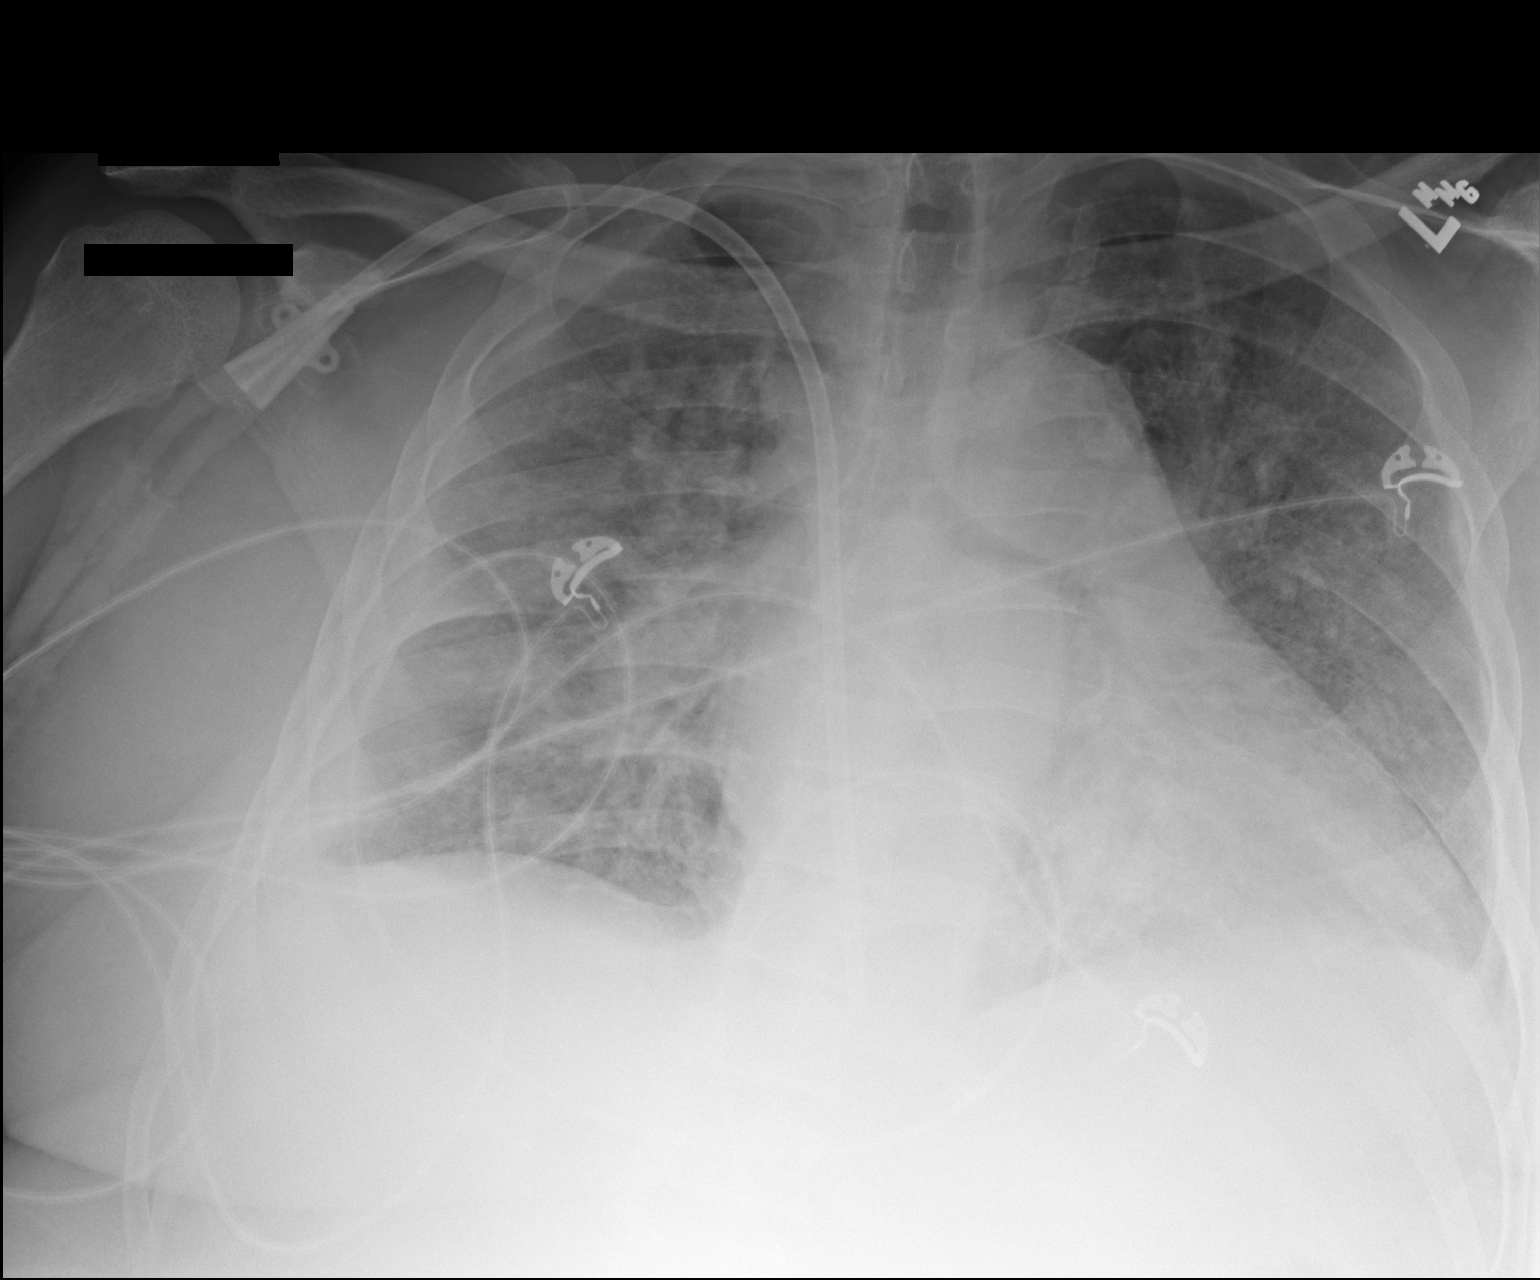

[1 of 1 positions shown; findings below may reference images not displayed]

FINDINGS: Right IJ sheath noted with tip projected over right atrium.
Cardiomegaly with diffuse pulmonary infiltrates consistent pulmonary
edema. Small bilateral effusions. No pneumothorax.
IMPRESSION: 1. Right IJ sheath noted with tip projected over the right atrium.

2. Cardiomegaly with bilateral pulmonary infiltrates consistent
pulmonary edema. Bilateral pleural effusions.

## 2017-09-20 IMAGING — CR DG CHEST 1V PORT
1 series · 1 of 1 positions shown · non-contrast
Comparison: Chest x-rays dated 06/03/2016 and 06/02/2016.

CLINICAL DATA: Pulmonary edema.

EXAM:
PORTABLE CHEST 1 VIEW

[AP]
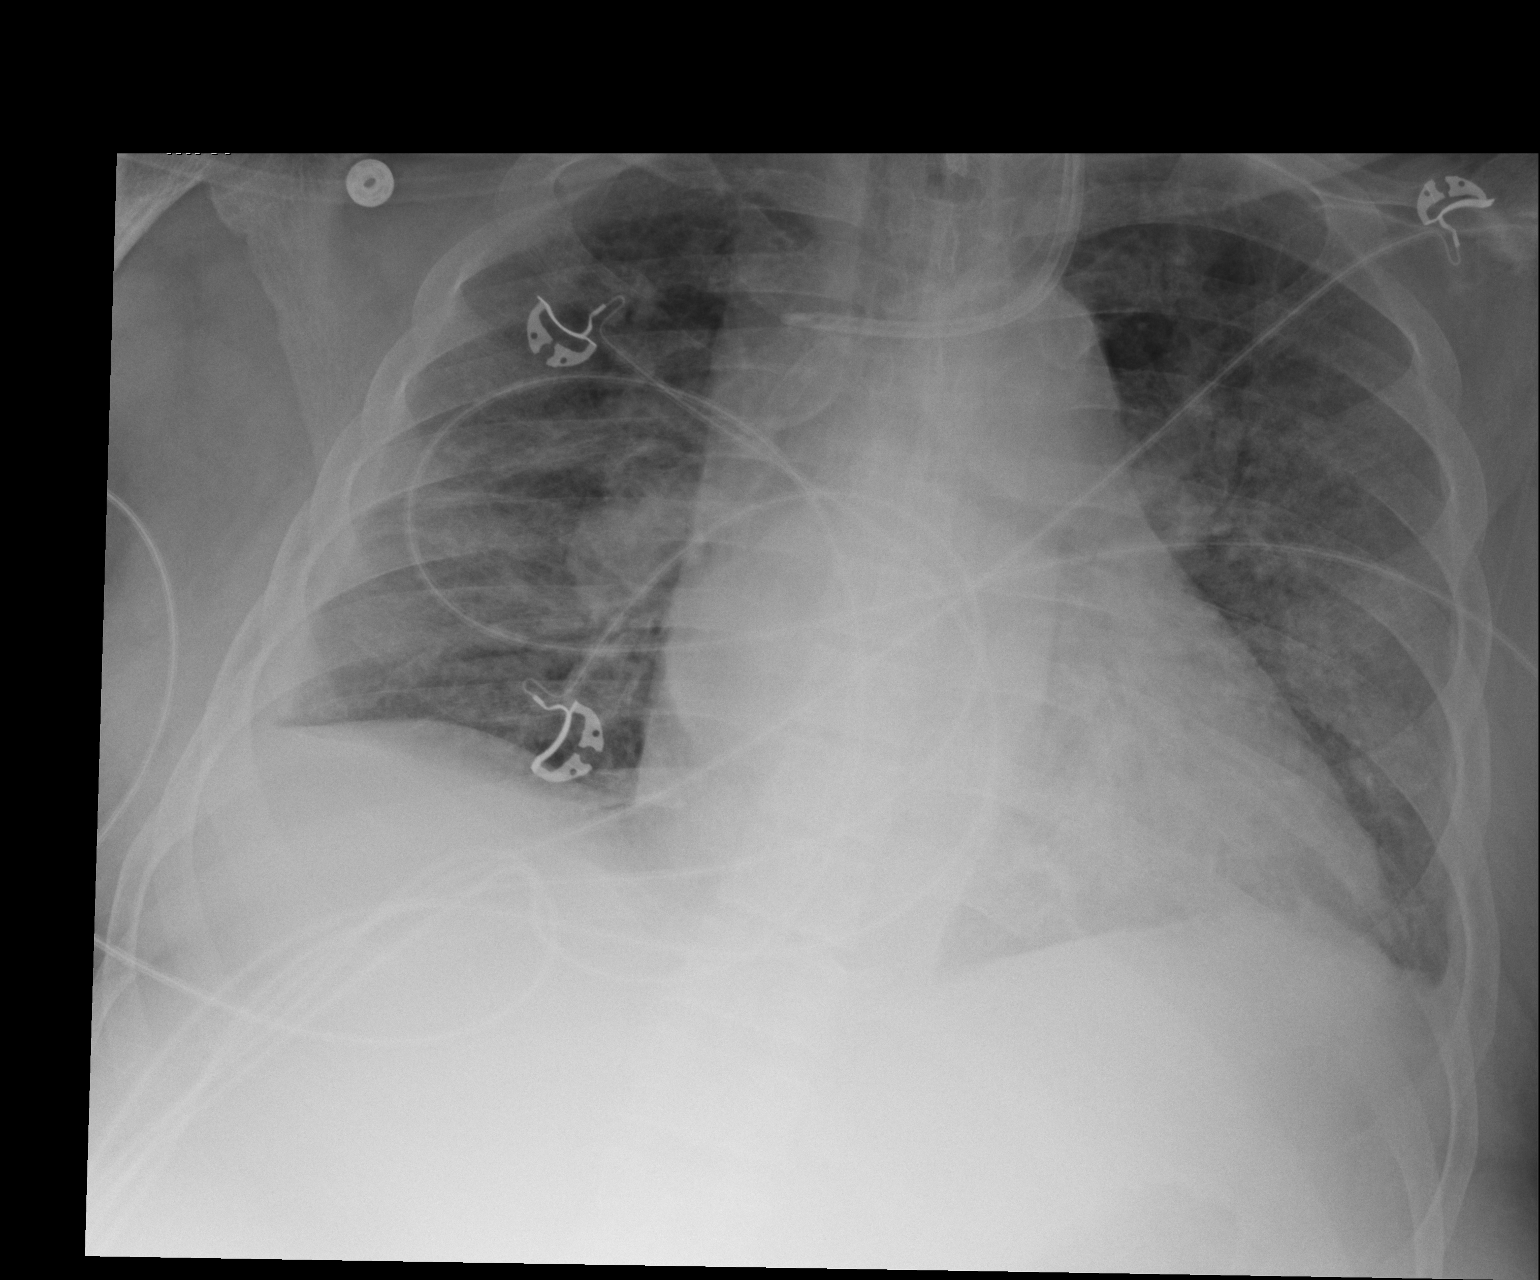

[1 of 1 positions shown; findings below may reference images not displayed]

FINDINGS: Cardiomegaly is stable. Overall cardiomediastinal silhouette is
stable in size and configuration. Left IJ catheter stable in
position with tip at the upper margin of the SVC.

Bilateral pulmonary edema is stable. Small bilateral pleural
effusions are stable. No new lung findings.
IMPRESSION: Stable exam. Pulmonary edema is unchanged. Small bilateral pleural
effusions again noted. Cardiomegaly is stable.

## 2017-09-22 IMAGING — CR DG CHEST 1V PORT
1 series · 1 of 1 positions shown · non-contrast
Comparison: June 04, 2016

CLINICAL DATA: Respiratory failure

EXAM:
PORTABLE CHEST 1 VIEW

[AP]
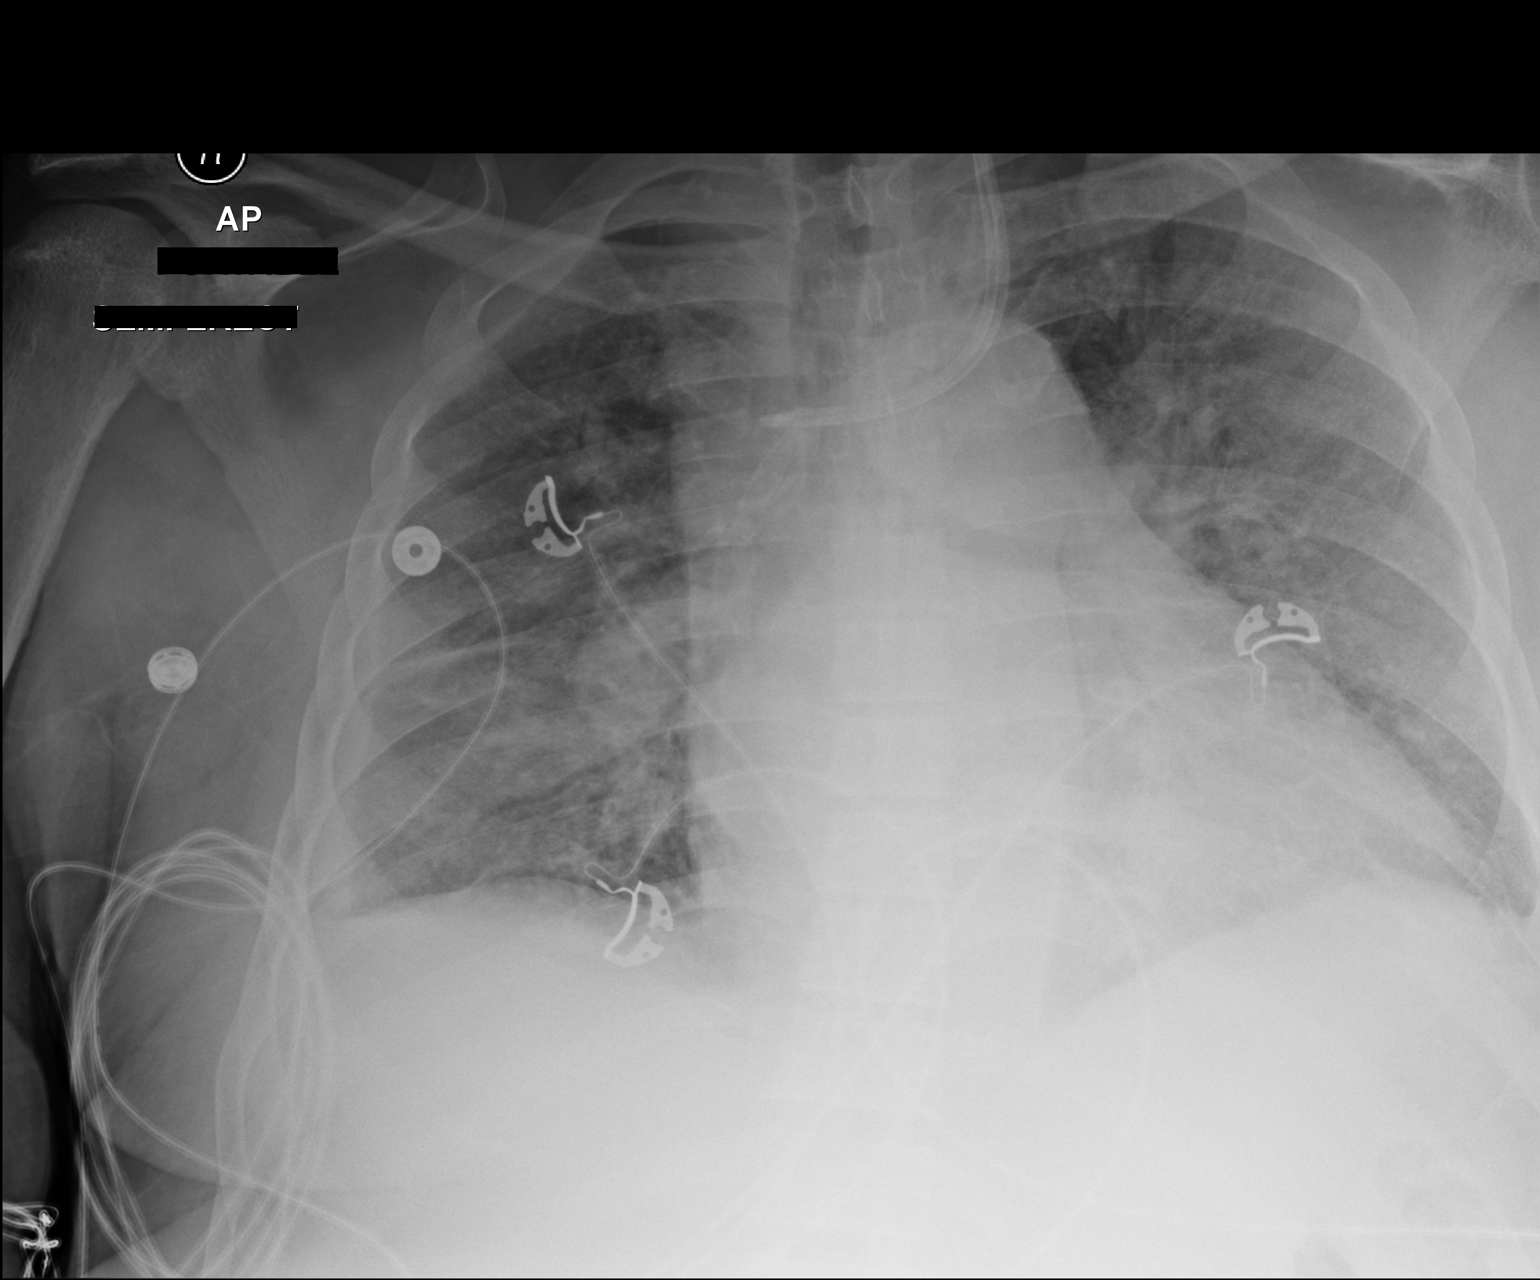

[1 of 1 positions shown; findings below may reference images not displayed]

FINDINGS: Central catheter tip is at the junction of the left innominate vein
and superior vena cava. No pneumothorax. There is interstitial edema
throughout both lungs. Mild patchy alveolar edema is present stable.
There is no new opacity. There is cardiomegaly with pulmonary venous
hypertension. No adenopathy evident.
IMPRESSION: Stable changes of congestive heart failure. In particular, the
distribution and degree of pulmonary edema stable. Cardiac
silhouette is stable. No pneumothorax. No change in central catheter
position.

## 2017-09-23 IMAGING — CR DG CHEST 1V PORT
1 series · 1 of 1 positions shown · non-contrast
Comparison: June 06, 2016

CLINICAL DATA: Respiratory failure, acute

EXAM:
PORTABLE CHEST 1 VIEW

[AP]
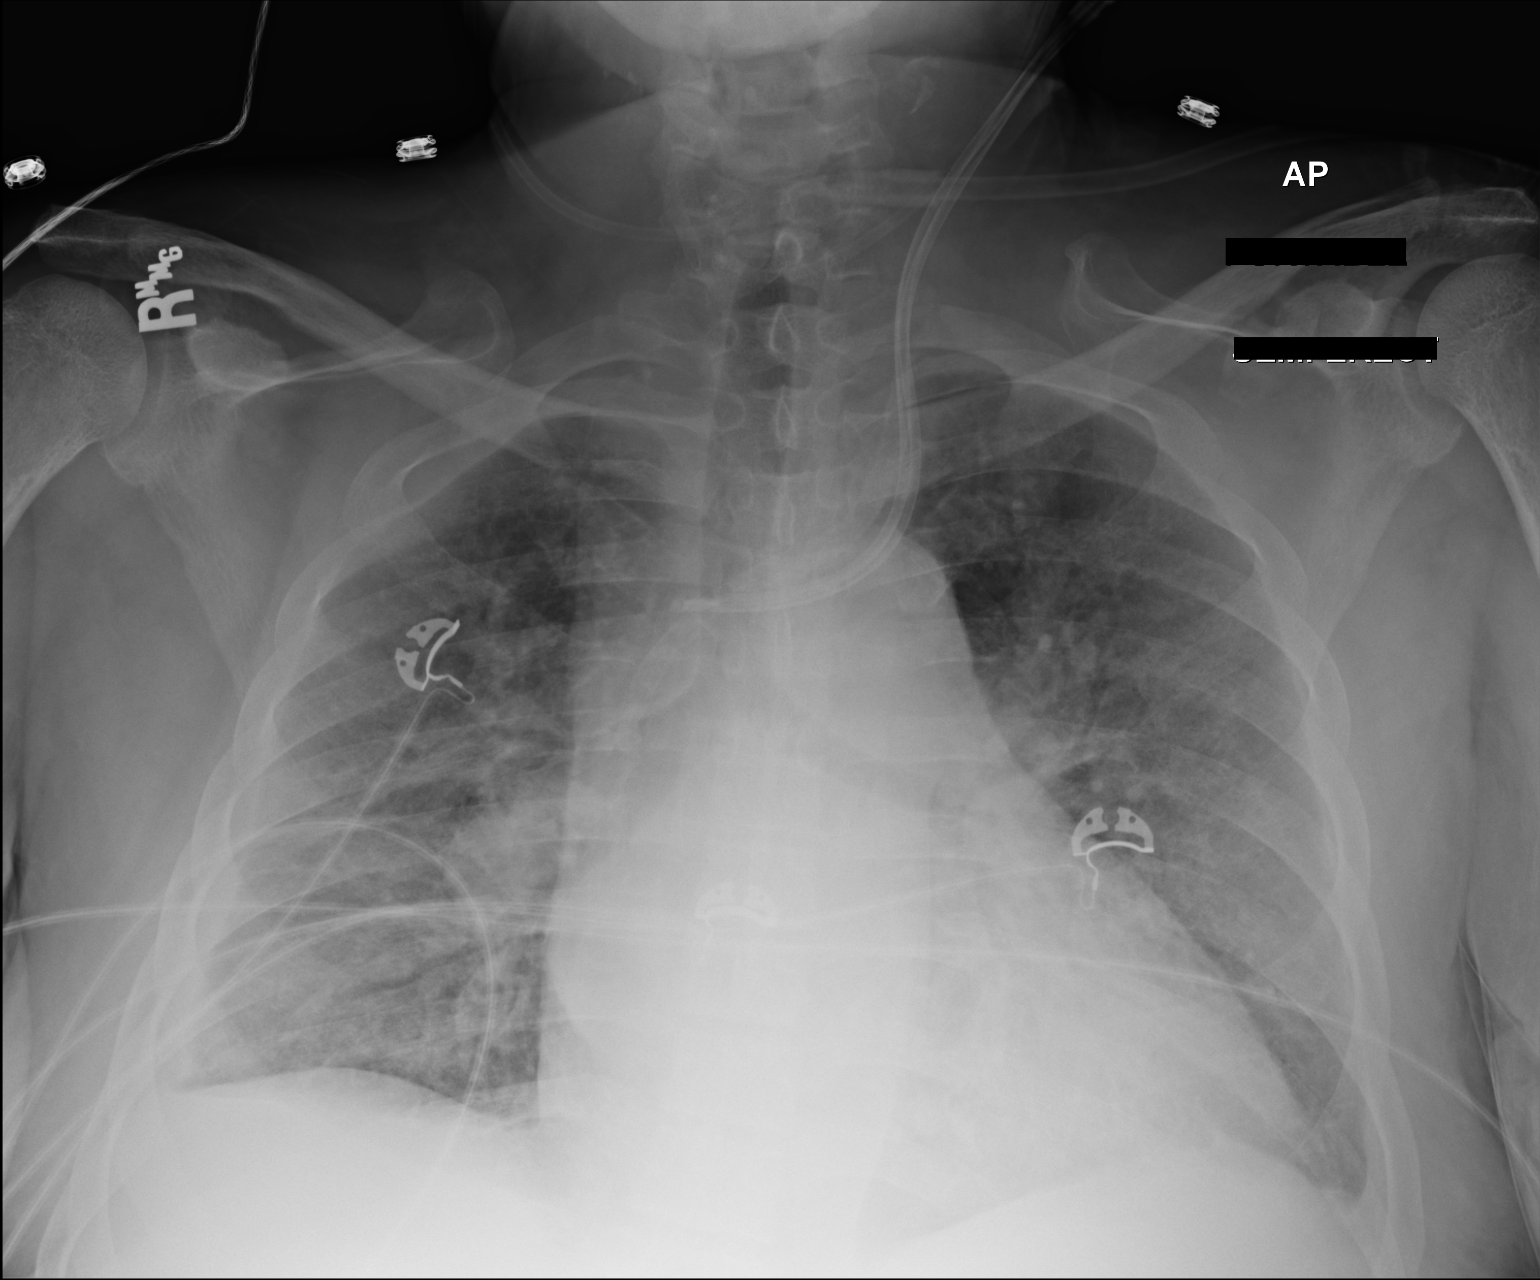

[1 of 1 positions shown; findings below may reference images not displayed]

FINDINGS: Central catheter tip is in the left innominate vein. No
pneumothorax. There remains interstitial edema bilaterally with
areas of patchy alveolar edema, stable. There is cardiomegaly with
pulmonary venous hypertension. No adenopathy. There are foci of
carotid artery calcification bilaterally.
IMPRESSION: Stable appearing changes of congestive heart failure. No new
opacity. No change in cardiac silhouette. Stable central catheter
position. No pneumothorax. Foci of carotid artery calcification
noted bilaterally.
